# Patient Record
Sex: Male | Born: 1955 | Race: Black or African American | Hispanic: No | Marital: Single | Smoking: Never smoker
Health system: Southern US, Community
[De-identification: ages and names within clinical notes are randomized; demographics above are authoritative.]

## PROBLEM LIST (undated history)

## (undated) DIAGNOSIS — C61 Malignant neoplasm of prostate: Secondary | ICD-10-CM

## (undated) DIAGNOSIS — E7800 Pure hypercholesterolemia, unspecified: Secondary | ICD-10-CM

## (undated) DIAGNOSIS — M1990 Unspecified osteoarthritis, unspecified site: Secondary | ICD-10-CM

## (undated) DIAGNOSIS — I10 Essential (primary) hypertension: Secondary | ICD-10-CM

## (undated) HISTORY — PX: ROTATOR CUFF REPAIR: SHX139

## (undated) HISTORY — PX: ELBOW SURGERY: SHX618

## (undated) HISTORY — PX: KNEE ARTHROSCOPY: SUR90

## (undated) HISTORY — PX: PROSTATE SURGERY: SHX751

---

## 2009-07-12 ENCOUNTER — Emergency Department (HOSPITAL_COMMUNITY): Admission: EM | Admit: 2009-07-12 | Discharge: 2009-07-12 | Payer: Self-pay | Admitting: Emergency Medicine

## 2010-09-03 ENCOUNTER — Emergency Department (HOSPITAL_COMMUNITY)
Admission: EM | Admit: 2010-09-03 | Discharge: 2010-09-03 | Disposition: A | Discharge status: Home / Self Care | Payer: Self-pay | Attending: Emergency Medicine | Admitting: Emergency Medicine

## 2010-09-03 DIAGNOSIS — N509 Disorder of male genital organs, unspecified: Secondary | ICD-10-CM | POA: Insufficient documentation

## 2010-09-03 DIAGNOSIS — E785 Hyperlipidemia, unspecified: Secondary | ICD-10-CM | POA: Insufficient documentation

## 2010-09-03 DIAGNOSIS — M545 Low back pain, unspecified: Secondary | ICD-10-CM | POA: Insufficient documentation

## 2010-09-03 DIAGNOSIS — M129 Arthropathy, unspecified: Secondary | ICD-10-CM | POA: Insufficient documentation

## 2010-09-03 DIAGNOSIS — R300 Dysuria: Secondary | ICD-10-CM | POA: Insufficient documentation

## 2010-09-03 DIAGNOSIS — N419 Inflammatory disease of prostate, unspecified: Secondary | ICD-10-CM | POA: Insufficient documentation

## 2010-09-03 DIAGNOSIS — I10 Essential (primary) hypertension: Secondary | ICD-10-CM | POA: Insufficient documentation

## 2010-09-03 DIAGNOSIS — Z79899 Other long term (current) drug therapy: Secondary | ICD-10-CM | POA: Insufficient documentation

## 2010-09-03 LAB — URINALYSIS, ROUTINE W REFLEX MICROSCOPIC
Hgb urine dipstick: NEGATIVE
Leukocytes, UA: NEGATIVE
Specific Gravity, Urine: 1.017 (ref 1.005–1.030)
Urobilinogen, UA: 0.2 mg/dL (ref 0.0–1.0)
pH: 7.5 (ref 5.0–8.0)

## 2011-01-28 ENCOUNTER — Emergency Department (HOSPITAL_BASED_OUTPATIENT_CLINIC_OR_DEPARTMENT_OTHER)
Admission: EM | Admit: 2011-01-28 | Discharge: 2011-01-28 | Disposition: A | Discharge status: Home / Self Care | Payer: Medicare Other | Attending: Emergency Medicine | Admitting: Emergency Medicine

## 2011-01-28 ENCOUNTER — Encounter (HOSPITAL_BASED_OUTPATIENT_CLINIC_OR_DEPARTMENT_OTHER): Payer: Self-pay | Admitting: Family Medicine

## 2011-01-28 DIAGNOSIS — Z8739 Personal history of other diseases of the musculoskeletal system and connective tissue: Secondary | ICD-10-CM | POA: Insufficient documentation

## 2011-01-28 DIAGNOSIS — R109 Unspecified abdominal pain: Secondary | ICD-10-CM | POA: Insufficient documentation

## 2011-01-28 DIAGNOSIS — N419 Inflammatory disease of prostate, unspecified: Secondary | ICD-10-CM

## 2011-01-28 DIAGNOSIS — E119 Type 2 diabetes mellitus without complications: Secondary | ICD-10-CM | POA: Insufficient documentation

## 2011-01-28 DIAGNOSIS — I10 Essential (primary) hypertension: Secondary | ICD-10-CM | POA: Insufficient documentation

## 2011-01-28 DIAGNOSIS — E7800 Pure hypercholesterolemia, unspecified: Secondary | ICD-10-CM | POA: Insufficient documentation

## 2011-01-28 DIAGNOSIS — Z79899 Other long term (current) drug therapy: Secondary | ICD-10-CM | POA: Insufficient documentation

## 2011-01-28 HISTORY — DX: Unspecified osteoarthritis, unspecified site: M19.90

## 2011-01-28 HISTORY — DX: Essential (primary) hypertension: I10

## 2011-01-28 HISTORY — DX: Pure hypercholesterolemia, unspecified: E78.00

## 2011-01-28 NOTE — ED Provider Notes (Signed)
History     CSN: [id number]  Arrival date & time 01/28/11  0802   First MD Initiated Contact with Patient 01/28/11 [phone]      Chief Complaint  Patient presents with  . Groin Pain    (Consider location/radiation/quality/duration/timing/severity/associated sxs/prior treatment) HPI  Patient states he has had pain in the groin area for a month. The pain occurs when he attempts to urinate. He was seen by a urologist last week and started on Bactrim. He states he is here for second opinion. He has not had fever or chills, nausea or vomiting. He has not had this problem prior to a month ago. He states that he was seen at the community [facility] but has plans to followup at a primary care doctor now. He denies any urethral discharge, swelling, testicular pain, or pain with increased with lifting. Upon further investigation by nursing, it was found that he has only taken 2 of the 40 Bactrim he was prescribed last week. He states that he was going to have it filled today because he couldn't afford it initially.  Past Medical History  Diagnosis Date  . Hypertension   . Arthritis   . Diabetes mellitus   . High cholesterol     Past Surgical History  Procedure Date  . Knee arthroscopy   . Rotator cuff repair     No family history on file.  History  Substance Use Topics  . Smoking status: Never Smoker   . Smokeless tobacco: Not on file  . Alcohol Use: Yes      Review of Systems  All other systems reviewed and are negative.    Allergies  Review of patient's allergies indicates no known allergies.  Home Medications   Current Outpatient Rx  Name Route Sig Dispense Refill  . AMITRIPTYLINE HCL 25 MG PO TABS Oral Take 25 mg by mouth at bedtime.    Marland Kitchen AMLODIPINE BESYLATE 10 MG PO TABS Oral Take 10 mg by mouth daily.    . ATORVASTATIN CALCIUM 40 MG PO TABS Oral Take 40 mg by mouth daily.    . MELOXICAM 7.5 MG PO TABS Oral Take 7.5 mg by mouth daily.    Marland Kitchen METFORMIN HCL 500 MG PO TABS  Oral Take 500 mg by mouth 2 (two) times daily with a meal.    . SIMVASTATIN 40 MG PO TABS Oral Take 40 mg by mouth every evening.    . SULFAMETHOXAZOLE-TMP DS 800-160 MG PO TABS Oral Take 1 tablet by mouth.      BP 135/90  Pulse 71  Temp(Src) 97.9 F (36.6 C) (Oral)  Resp 16  Ht 5\' 9"  (1.753 m)  Wt 190 lb (86.183 kg)  BMI 28.06 kg/m2  SpO2 100%  Physical Exam  Vitals reviewed. Constitutional: He is oriented to person, place, and time. He appears well-developed and well-nourished.  HENT:  Head: Normocephalic and atraumatic.  Eyes: Conjunctivae are normal. Pupils are equal, round, and reactive to light.  Neck: Normal range of motion. Neck supple.  Cardiovascular: Normal rate.   Pulmonary/Chest: Effort normal and breath sounds normal.  Abdominal: Soft. Bowel sounds are normal.  Genitourinary: Rectum normal, testes normal and penis normal. Prostate is enlarged and tender.  Musculoskeletal: Normal range of motion.  Neurological: He is alert and oriented to person, place, and time.  Skin: Skin is warm and dry.    ED Course  Procedures (including critical care time)  Labs Reviewed - No data to display No results found.  No diagnosis found.    MDM  Patient has been seen and diagnosed by urology. I do not think that further urinalysis would be helpful here. He is advised to get his Bactrim filled and to take the entire course. He is to followup with urology.       Hilario Quarry, MD 01/28/11 [phone]

## 2011-01-28 NOTE — ED Notes (Addendum)
Pt c/o groin pain x 2 wks intermittent and worse when having to urinate. Pt denies injury, denies swelling. Pt sts he went to urology on [address] and they dx pt with "prostate infection". Pt sts he wants a second opinion.

## 2012-07-18 ENCOUNTER — Encounter (HOSPITAL_BASED_OUTPATIENT_CLINIC_OR_DEPARTMENT_OTHER): Payer: Self-pay | Admitting: *Deleted

## 2012-07-18 ENCOUNTER — Emergency Department (HOSPITAL_BASED_OUTPATIENT_CLINIC_OR_DEPARTMENT_OTHER)
Admission: EM | Admit: 2012-07-18 | Discharge: 2012-07-18 | Disposition: A | Discharge status: Home / Self Care | Payer: Medicare Other | Attending: Emergency Medicine | Admitting: Emergency Medicine

## 2012-07-18 DIAGNOSIS — H6123 Impacted cerumen, bilateral: Secondary | ICD-10-CM

## 2012-07-18 DIAGNOSIS — Z8739 Personal history of other diseases of the musculoskeletal system and connective tissue: Secondary | ICD-10-CM | POA: Insufficient documentation

## 2012-07-18 DIAGNOSIS — E119 Type 2 diabetes mellitus without complications: Secondary | ICD-10-CM | POA: Insufficient documentation

## 2012-07-18 DIAGNOSIS — I10 Essential (primary) hypertension: Secondary | ICD-10-CM | POA: Insufficient documentation

## 2012-07-18 DIAGNOSIS — E7800 Pure hypercholesterolemia, unspecified: Secondary | ICD-10-CM | POA: Insufficient documentation

## 2012-07-18 DIAGNOSIS — Z79899 Other long term (current) drug therapy: Secondary | ICD-10-CM | POA: Insufficient documentation

## 2012-07-18 DIAGNOSIS — H6120 Impacted cerumen, unspecified ear: Secondary | ICD-10-CM | POA: Insufficient documentation

## 2012-07-18 MED ORDER — DOCUSATE SODIUM 50 MG/5ML PO LIQD
ORAL | Status: AC
  Administered 2012-07-18: 50 mg via OTIC
  Filled 2012-07-18: qty 10

## 2012-07-18 MED ORDER — DOCUSATE SODIUM 50 MG/5ML PO LIQD
50.0000 mg | Freq: Once | ORAL | Status: AC
  Administered 2012-07-18: 50 mg via OTIC

## 2012-07-18 NOTE — ED Notes (Signed)
Patient c/o L ear pain for past two weeks, no meds taken

## 2012-07-18 NOTE — ED Provider Notes (Signed)
History    CSN: [id number] Arrival date & time 07/18/12  0911  First MD Initiated Contact with Patient 07/18/12 [phone]     Chief Complaint  Patient presents with  . Otalgia   (Consider location/radiation/quality/duration/timing/severity/associated sxs/prior Treatment) Patient is a 57 y.o. male presenting with ear pain. The history is provided by the patient.  Otalgia Location:  Left Behind ear:  No abnormality Quality:  Aching, pressure and sore Severity:  Moderate Onset quality:  Gradual Duration:  2 weeks Timing:  Intermittent (started intermittent and now constant) Progression:  Worsening Chronicity:  New Context: not direct blow, not elevation change, not foreign body in ear and not loud noise   Relieved by:  Nothing Ineffective treatments:  None tried Associated symptoms: no abdominal pain, no ear discharge, no fever, no hearing loss, no neck pain and no vomiting   Risk factors: no chronic ear infection    Past Medical History  Diagnosis Date  . Hypertension   . Arthritis   . Diabetes mellitus   . High cholesterol    Past Surgical History  Procedure Laterality Date  . Knee arthroscopy    . Rotator cuff repair     No family history on file. History  Substance Use Topics  . Smoking status: Never Smoker   . Smokeless tobacco: Not on file  . Alcohol Use: Yes    Review of Systems  Constitutional: Negative for fever.  HENT: Positive for ear pain. Negative for hearing loss, neck pain and ear discharge.   Gastrointestinal: Negative for vomiting and abdominal pain.  All other systems reviewed and are negative.    Allergies  Review of patient's allergies indicates no known allergies.  Home Medications   Current Outpatient Rx  Name  Route  Sig  Dispense  Refill  . amitriptyline (ELAVIL) 25 MG tablet   Oral   Take 25 mg by mouth at bedtime.         Marland Kitchen amLODipine (NORVASC) 10 MG tablet   Oral   Take 10 mg by mouth daily.         Marland Kitchen atorvastatin  (LIPITOR) 40 MG tablet   Oral   Take 40 mg by mouth daily.         . meloxicam (MOBIC) 7.5 MG tablet   Oral   Take 7.5 mg by mouth daily.         . metFORMIN (GLUCOPHAGE) 500 MG tablet   Oral   Take 500 mg by mouth 2 (two) times daily with a meal.         . simvastatin (ZOCOR) 40 MG tablet   Oral   Take 40 mg by mouth every evening.         . sulfamethoxazole-trimethoprim (BACTRIM DS) 800-160 MG per tablet   Oral   Take 1 tablet by mouth.          BP 135/92  Pulse 95  Temp(Src) 98.1 F (36.7 C) (Oral)  Resp 16  Ht 5\' 9"  (1.753 m)  Wt 200 lb (90.719 kg)  BMI 29.52 kg/m2  SpO2 95% Physical Exam  Nursing note and vitals reviewed. Constitutional: He is oriented to person, place, and time. He appears well-developed and well-nourished. No distress.  HENT:  Head: Normocephalic and atraumatic.  Right Ear: External ear normal. No drainage or swelling. No mastoid tenderness.  Left Ear: External ear normal. No drainage or swelling. No mastoid tenderness.  Pain with movement of the left ear with cerumen impaction.  Cerumen impaction  on the right as well  Eyes: EOM are normal. Pupils are equal, round, and reactive to light.  Cardiovascular: Normal rate.   Pulmonary/Chest: Effort normal.  Neurological: He is alert and oriented to person, place, and time.  Skin: Skin is warm and dry. No rash noted. No erythema.    ED Course  Procedures (including critical care time) Labs Reviewed - No data to display No results found. 1. Cerumen impaction, bilateral     MDM   Patient here complaining of left ear pain without infectious symptoms. No mastoid tenderness concerning for mastoiditis. Pain with movement of the external ear cerumen impaction upon exam. Unable to renew with curettes so Colace applied to the ear and will attempt cerumen removal a second time.  12:43 PM After multiple flushes and colace large ball of cerumen removed from both ears.  Gwyneth Sprout,  MD 07/18/12 1244

## 2012-07-18 NOTE — ED Notes (Signed)
Flushed patient's ear with colace and warm water to dissolve wax buildup in L ear

## 2012-11-19 ENCOUNTER — Emergency Department (HOSPITAL_BASED_OUTPATIENT_CLINIC_OR_DEPARTMENT_OTHER)
Admission: EM | Admit: 2012-11-19 | Discharge: 2012-11-19 | Disposition: A | Discharge status: Home / Self Care | Payer: Medicare Other | Attending: Emergency Medicine | Admitting: Emergency Medicine

## 2012-11-19 ENCOUNTER — Encounter (HOSPITAL_BASED_OUTPATIENT_CLINIC_OR_DEPARTMENT_OTHER): Payer: Self-pay | Admitting: Emergency Medicine

## 2012-11-19 DIAGNOSIS — Z79899 Other long term (current) drug therapy: Secondary | ICD-10-CM | POA: Insufficient documentation

## 2012-11-19 DIAGNOSIS — M129 Arthropathy, unspecified: Secondary | ICD-10-CM | POA: Insufficient documentation

## 2012-11-19 DIAGNOSIS — B86 Scabies: Secondary | ICD-10-CM

## 2012-11-19 DIAGNOSIS — I10 Essential (primary) hypertension: Secondary | ICD-10-CM | POA: Insufficient documentation

## 2012-11-19 DIAGNOSIS — E7800 Pure hypercholesterolemia, unspecified: Secondary | ICD-10-CM | POA: Insufficient documentation

## 2012-11-19 DIAGNOSIS — E119 Type 2 diabetes mellitus without complications: Secondary | ICD-10-CM | POA: Insufficient documentation

## 2012-11-19 DIAGNOSIS — Z791 Long term (current) use of non-steroidal anti-inflammatories (NSAID): Secondary | ICD-10-CM | POA: Insufficient documentation

## 2012-11-19 MED ORDER — PERMETHRIN 5 % EX CREA: TOPICAL_CREAM | CUTANEOUS | Status: DC

## 2012-11-19 NOTE — ED Provider Notes (Signed)
CSN: [id number]     Arrival date & time 11/19/12  1157 History   First MD Initiated Contact with Patient 11/19/12 1217     Chief Complaint  Patient presents with  . Rash   (Consider location/radiation/quality/duration/timing/severity/associated sxs/prior Treatment) Patient is a 57 y.o. male presenting with rash. The history is provided by the patient. No language interpreter was used.  Rash Location:  Full body Quality: itchiness   Severity:  Moderate Onset quality:  Gradual Duration:  3 days Timing:  Constant Progression:  Worsening Chronicity:  New Context: insect bite/sting   Relieved by:  Nothing Worsened by:  Nothing tried Ineffective treatments:  None tried   Past Medical History  Diagnosis Date  . Hypertension   . Arthritis   . Diabetes mellitus   . High cholesterol    Past Surgical History  Procedure Laterality Date  . Knee arthroscopy    . Rotator cuff repair     No family history on file. History  Substance Use Topics  . Smoking status: Never Smoker   . Smokeless tobacco: Not on file  . Alcohol Use: Yes     Comment: occasional    Review of Systems  Skin: Positive for rash.  All other systems reviewed and are negative.    Allergies  Review of patient's allergies indicates no known allergies.  Home Medications   Current Outpatient Rx  Name  Route  Sig  Dispense  Refill  . amitriptyline (ELAVIL) 25 MG tablet   Oral   Take 25 mg by mouth at bedtime.         Marland Kitchen amLODipine (NORVASC) 10 MG tablet   Oral   Take 10 mg by mouth daily.         Marland Kitchen atorvastatin (LIPITOR) 40 MG tablet   Oral   Take 40 mg by mouth daily.         . meloxicam (MOBIC) 7.5 MG tablet   Oral   Take 7.5 mg by mouth daily.         . metFORMIN (GLUCOPHAGE) 500 MG tablet   Oral   Take 500 mg by mouth 2 (two) times daily with a meal.         . permethrin (ELIMITE) 5 % cream      Apply to affected area once   60 g   1    BP 154/97  Pulse 94  Temp(Src) 98.4  F (36.9 C) (Oral)  Resp 18  Ht 5\' 9"  (1.753 m)  Wt 193 lb (87.544 kg)  BMI 28.49 kg/m2  SpO2 100% Physical Exam  Nursing note and vitals reviewed. Constitutional: He is oriented to person, place, and time. He appears well-developed and well-nourished.  HENT:  Head: Normocephalic.  Eyes: EOM are normal.  Neck: Normal range of motion.  Pulmonary/Chest: Effort normal.  Musculoskeletal: Normal range of motion.  Neurological: He is alert and oriented to person, place, and time.  Skin: Rash noted.  Multiple burrows   Psychiatric: He has a normal mood and affect.    ED Course  Procedures (including critical care time) Labs Review Labs Reviewed - No data to display Imaging Review No results found.  EKG Interpretation   None       MDM   1. Scabies    elemite    Elson Areas, PA-C 11/19/12 1230

## 2012-11-19 NOTE — ED Notes (Signed)
Rash on upper extremities since Friday.

## 2012-11-20 NOTE — ED Provider Notes (Signed)
Medical screening examination/treatment/procedure(s) were performed by non-physician practitioner and as supervising physician I was immediately available for consultation/collaboration.  EKG Interpretation   None         Candyce Churn, MD 11/20/12 [phone]

## 2014-03-12 ENCOUNTER — Emergency Department (HOSPITAL_BASED_OUTPATIENT_CLINIC_OR_DEPARTMENT_OTHER): Payer: Medicare Other

## 2014-03-12 ENCOUNTER — Encounter (HOSPITAL_BASED_OUTPATIENT_CLINIC_OR_DEPARTMENT_OTHER): Payer: Self-pay

## 2014-03-12 ENCOUNTER — Emergency Department (HOSPITAL_BASED_OUTPATIENT_CLINIC_OR_DEPARTMENT_OTHER)
Admission: EM | Admit: 2014-03-12 | Discharge: 2014-03-12 | Disposition: A | Discharge status: Home / Self Care | Payer: Medicare Other | Attending: Emergency Medicine | Admitting: Emergency Medicine

## 2014-03-12 DIAGNOSIS — I10 Essential (primary) hypertension: Secondary | ICD-10-CM | POA: Insufficient documentation

## 2014-03-12 DIAGNOSIS — E119 Type 2 diabetes mellitus without complications: Secondary | ICD-10-CM | POA: Diagnosis not present

## 2014-03-12 DIAGNOSIS — Z8739 Personal history of other diseases of the musculoskeletal system and connective tissue: Secondary | ICD-10-CM | POA: Diagnosis not present

## 2014-03-12 DIAGNOSIS — K2970 Gastritis, unspecified, without bleeding: Secondary | ICD-10-CM

## 2014-03-12 DIAGNOSIS — R079 Chest pain, unspecified: Secondary | ICD-10-CM | POA: Diagnosis present

## 2014-03-12 DIAGNOSIS — Z79899 Other long term (current) drug therapy: Secondary | ICD-10-CM | POA: Insufficient documentation

## 2014-03-12 LAB — BASIC METABOLIC PANEL
ANION GAP: 12 (ref 5–15)
BUN: 9 mg/dL (ref 6–23)
CALCIUM: 8.9 mg/dL (ref 8.4–10.5)
CHLORIDE: 95 mmol/L — AB (ref 96–112)
CO2: 26 mmol/L (ref 19–32)
Creatinine, Ser: 1.33 mg/dL (ref 0.50–1.35)
GFR calc Af Amer: 66 mL/min — ABNORMAL LOW (ref >90)
GFR, EST NON AFRICAN AMERICAN: 57 mL/min — AB (ref >90)
Glucose, Bld: 107 mg/dL — ABNORMAL HIGH (ref 70–99)
POTASSIUM: 4 mmol/L (ref 3.5–5.1)
SODIUM: 133 mmol/L — AB (ref 135–145)

## 2014-03-12 LAB — CBG MONITORING, ED: GLUCOSE-CAPILLARY: 147 mg/dL — AB (ref 70–99)

## 2014-03-12 LAB — CBC
HEMATOCRIT: 41.3 % (ref 39.0–52.0)
HEMOGLOBIN: 13.9 g/dL (ref 13.0–17.0)
MCH: 27.4 pg (ref 26.0–34.0)
MCHC: 33.7 g/dL (ref 30.0–36.0)
MCV: 81.3 fL (ref 78.0–100.0)
Platelets: 214 10*3/uL (ref 150–400)
RBC: 5.08 MIL/uL (ref 4.22–5.81)
RDW: 13.9 % (ref 11.5–15.5)
WBC: 11.3 10*3/uL — ABNORMAL HIGH (ref 4.0–10.5)

## 2014-03-12 LAB — TROPONIN I: Troponin I: <0.03 ng/mL (ref <0.031)

## 2014-03-12 LAB — LIPASE, BLOOD: LIPASE: 31 U/L (ref 11–59)

## 2014-03-12 MED ORDER — PANTOPRAZOLE SODIUM 40 MG IV SOLR
40.0000 mg | Freq: Once | INTRAVENOUS | Status: AC
  Administered 2014-03-12: 40 mg via INTRAVENOUS
  Filled 2014-03-12: qty 40

## 2014-03-12 MED ORDER — SODIUM CHLORIDE 0.9 % IV BOLUS (SEPSIS)
1000.0000 mL | Freq: Once | INTRAVENOUS | Status: AC
  Administered 2014-03-12: 1000 mL via INTRAVENOUS

## 2014-03-12 MED ORDER — OMEPRAZOLE 40 MG PO CPDR: 40.0000 mg | DELAYED_RELEASE_CAPSULE | Freq: Every day | ORAL | Status: DC

## 2014-03-12 MED ORDER — SUCRALFATE 1 G PO TABS: 1.0000 g | ORAL_TABLET | Freq: Three times a day (TID) | ORAL | Status: DC

## 2014-03-12 MED ORDER — GI COCKTAIL ~~LOC~~
30.0000 mL | Freq: Once | ORAL | Status: AC
  Administered 2014-03-12: 30 mL via ORAL
  Filled 2014-03-12: qty 30

## 2014-03-12 NOTE — Discharge Instructions (Signed)

## 2014-03-12 NOTE — ED Notes (Signed)
Patient c/o heartburn since yesterday, took tums no relief, took pepcid this morning and felt a little relief

## 2014-03-12 NOTE — ED Provider Notes (Signed)
CSN: [id number]     Arrival date & time 03/12/14  1013 History   First MD Initiated Contact with Patient 03/12/14 1028     Chief Complaint  Patient presents with  . Chest Pain     (Consider location/radiation/quality/duration/timing/severity/associated sxs/prior Treatment) HPI Comments: Patient presents with heartburn. He states that he's had a burning in his mouth and down his throat and into his upper abdomen that started yesterday and it's been constant since that time. It's gotten worse this morning. He feels a burning taste in his mouth and his throat. He states he drank about a quart of beer yesterday. He has a history of reflux. He denies any known history of heart disease. He denies any shortness of breath. He denies any diaphoresis. The pain is not related to exertion. He denies any leg pain or swelling. He denies any nausea or vomiting.   Past Medical History  Diagnosis Date  . Hypertension   . Arthritis   . Diabetes mellitus   . High cholesterol    Past Surgical History  Procedure Laterality Date  . Knee arthroscopy    . Rotator cuff repair     No family history on file. History  Substance Use Topics  . Smoking status: Never Smoker   . Smokeless tobacco: Not on file  . Alcohol Use: Yes     Comment: occasional    Review of Systems  Constitutional: Negative for fever, chills, diaphoresis and fatigue.  HENT: Negative for congestion, rhinorrhea and sneezing.   Eyes: Negative.   Respiratory: Negative for cough, chest tightness and shortness of breath.   Cardiovascular: Negative for chest pain and leg swelling.  Gastrointestinal: Positive for abdominal pain. Negative for nausea, vomiting, diarrhea and blood in stool.  Genitourinary: Negative for frequency, hematuria, flank pain and difficulty urinating.  Musculoskeletal: Negative for back pain and arthralgias.  Skin: Negative for rash.  Neurological: Negative for dizziness, speech difficulty, weakness, numbness and  headaches.      Allergies  Review of patient's allergies indicates no known allergies.  Home Medications   Prior to Admission medications   Medication Sig Start Date End Date Taking? Authorizing Provider  metFORMIN (GLUCOPHAGE) 500 MG tablet Take 500 mg by mouth 2 (two) times daily with a meal.    Historical Provider, MD  omeprazole (PRILOSEC) 40 MG capsule Take 1 capsule (40 mg total) by mouth daily. 03/12/14   Malvin Johns, MD  permethrin (ELIMITE) 5 % cream Apply to affected area once 11/19/12   Fransico Meadow, PA-C  sucralfate (CARAFATE) 1 G tablet Take 1 tablet (1 g total) by mouth 4 (four) times daily -  with meals and at bedtime. 03/12/14   Malvin Johns, MD   BP 144/91 mmHg  Pulse 104  Temp(Src) 98.5 F (36.9 C)  Resp 18  Ht 5\' 9"  (1.753 m)  Wt 180 lb (81.647 kg)  BMI 26.57 kg/m2  SpO2 98% Physical Exam  Constitutional: He is oriented to person, place, and time. He appears well-developed and well-nourished.  HENT:  Head: Normocephalic and atraumatic.  Eyes: Pupils are equal, round, and reactive to light.  Neck: Normal range of motion. Neck supple.  Cardiovascular: Normal rate, regular rhythm and normal heart sounds.   Pulmonary/Chest: Effort normal and breath sounds normal. No respiratory distress. He has no wheezes. He has no rales. He exhibits no tenderness.  Abdominal: Soft. Bowel sounds are normal. There is tenderness (Positive mild tenderness to the epigastrium). There is no rebound and no guarding.  Musculoskeletal: Normal range of motion. He exhibits no edema.  Lymphadenopathy:    He has no cervical adenopathy.  Neurological: He is alert and oriented to person, place, and time.  Skin: Skin is warm and dry. No rash noted.  Psychiatric: He has a normal mood and affect.    ED Course  Procedures (including critical care time) Labs Review Labs Reviewed  BASIC METABOLIC PANEL - Abnormal; Notable for the following:    Sodium 133 (*)    Chloride 95 (*)     Glucose, Bld 107 (*)    GFR calc non Af Amer 57 (*)    GFR calc Af Amer 66 (*)    All other components within normal limits  CBC - Abnormal; Notable for the following:    WBC 11.3 (*)    All other components within normal limits  CBG MONITORING, ED - Abnormal; Notable for the following:    Glucose-Capillary 147 (*)    All other components within normal limits  TROPONIN I  LIPASE, BLOOD    Imaging Review Dg Chest 2 View  03/12/2014   CLINICAL DATA:  Heartburn.  Short of breath for 1 day.  EXAM: CHEST  2 VIEW  COMPARISON:  None.  FINDINGS: Mild bibasilar atelectasis. Normal heart size. 7 mm left midlung nodule. No pneumothorax. No pleural effusion.  IMPRESSION: 7 mm left midlung nodule. Comparison with prior studies would be helpful. If none are available, CT can be performed.   Electronically Signed   By: Marybelle Killings M.D.   On: 03/12/2014 11:30     EKG Interpretation   Date/Time:  Saturday March 12 2014 10:21:15 EST Ventricular Rate:  104 PR Interval:  178 QRS Duration: 84 QT Interval:  330 QTC Calculation: 433 R Axis:   20 Text Interpretation:  Sinus tachycardia Moderate voltage criteria for LVH,  may be normal variant Borderline ECG No old tracing to compare Confirmed  by Criss Bartles  MD, Prabhjot Maddux ([id number]) on 03/12/2014 10:34:19 AM      MDM   Final diagnoses:  Gastritis    Patient got almost complete relief with a GI cocktail. He was also given Protonix. His pain came back a little bit during the ED stay but was improved. His symptoms sound consistent with gastritis. There is no evidence of pancreatitis. He his pain was almost completely relieved with a GI cocktail and I have low suspicion for other etiologies such as perforated bowel. He's had no vomiting. His symptoms do not sound consistent with acute coronary syndrome. His troponin is negative. He was discharged home in good condition with a prescription for Carafate and Prilosec. He was encouraged to stop drinking. He was  given a referral to follow-up with GI. Return precautions were given.   Malvin Johns, MD 03/12/14 1328

## 2014-07-23 ENCOUNTER — Emergency Department (HOSPITAL_BASED_OUTPATIENT_CLINIC_OR_DEPARTMENT_OTHER)
Admission: EM | Admit: 2014-07-23 | Discharge: 2014-07-23 | Disposition: A | Discharge status: Home / Self Care | Payer: Medicare Other | Attending: Emergency Medicine | Admitting: Emergency Medicine

## 2014-07-23 ENCOUNTER — Emergency Department (HOSPITAL_BASED_OUTPATIENT_CLINIC_OR_DEPARTMENT_OTHER): Payer: Medicare Other

## 2014-07-23 ENCOUNTER — Encounter (HOSPITAL_BASED_OUTPATIENT_CLINIC_OR_DEPARTMENT_OTHER): Payer: Self-pay

## 2014-07-23 DIAGNOSIS — M1990 Unspecified osteoarthritis, unspecified site: Secondary | ICD-10-CM | POA: Diagnosis not present

## 2014-07-23 DIAGNOSIS — J029 Acute pharyngitis, unspecified: Secondary | ICD-10-CM | POA: Diagnosis present

## 2014-07-23 DIAGNOSIS — R05 Cough: Secondary | ICD-10-CM

## 2014-07-23 DIAGNOSIS — R059 Cough, unspecified: Secondary | ICD-10-CM

## 2014-07-23 DIAGNOSIS — Z79899 Other long term (current) drug therapy: Secondary | ICD-10-CM | POA: Diagnosis not present

## 2014-07-23 DIAGNOSIS — I10 Essential (primary) hypertension: Secondary | ICD-10-CM | POA: Diagnosis not present

## 2014-07-23 DIAGNOSIS — E119 Type 2 diabetes mellitus without complications: Secondary | ICD-10-CM | POA: Insufficient documentation

## 2014-07-23 DIAGNOSIS — J069 Acute upper respiratory infection, unspecified: Secondary | ICD-10-CM | POA: Diagnosis not present

## 2014-07-23 DIAGNOSIS — R21 Rash and other nonspecific skin eruption: Secondary | ICD-10-CM | POA: Insufficient documentation

## 2014-07-23 LAB — RAPID STREP SCREEN (MED CTR MEBANE ONLY): Streptococcus, Group A Screen (Direct): NEGATIVE

## 2014-07-23 MED ORDER — HYDROCODONE-ACETAMINOPHEN 5-325 MG PO TABS: 1.0000 | ORAL_TABLET | Freq: Four times a day (QID) | ORAL | Status: DC | PRN

## 2014-07-23 MED ORDER — DM-GUAIFENESIN ER 30-600 MG PO TB12: 1.0000 | ORAL_TABLET | Freq: Two times a day (BID) | ORAL | Status: DC

## 2014-07-23 NOTE — ED Provider Notes (Signed)
CSN: [id number]     Arrival date & time 07/23/14  0857 History   First MD Initiated Contact with Patient 07/23/14 [phone]     Chief Complaint  Patient presents with  . Sore Throat     (Consider location/radiation/quality/duration/timing/severity/associated sxs/prior Treatment) Patient is a 59 y.o. male presenting with pharyngitis. The history is provided by the patient.  Sore Throat Pertinent negatives include no chest pain, no abdominal pain, no headaches and no shortness of breath.   patient with 3 day history of sore throat cough occasionally productive one episode of vomiting. No diarrhea. No fevers. Patient noted white coating on tongue today. No sick exposures.  Past Medical History  Diagnosis Date  . Hypertension   . Arthritis   . Diabetes mellitus   . High cholesterol    Past Surgical History  Procedure Laterality Date  . Knee arthroscopy    . Rotator cuff repair    . Elbow surgery     No family history on file. History  Substance Use Topics  . Smoking status: Never Smoker   . Smokeless tobacco: Not on file  . Alcohol Use: Yes     Comment: occasional    Review of Systems  Constitutional: Negative for fever.  HENT: Positive for congestion and sore throat.   Respiratory: Positive for cough. Negative for shortness of breath.   Cardiovascular: Negative for chest pain.  Gastrointestinal: Negative for abdominal pain.  Genitourinary: Negative for dysuria.  Musculoskeletal: Negative for myalgias and neck pain.  Skin: Positive for rash.  Neurological: Negative for headaches.  Hematological: Does not bruise/bleed easily.  Psychiatric/Behavioral: Negative for confusion.      Allergies  Review of patient's allergies indicates no known allergies.  Home Medications   Prior to Admission medications   Medication Sig Start Date End Date Taking? Authorizing Provider  dextromethorphan-guaiFENesin (MUCINEX DM) 30-600 MG per 12 hr tablet Take 1 tablet by mouth 2 (two) times  daily. 07/23/14   Fredia Sorrow, MD  HYDROcodone-acetaminophen (NORCO/VICODIN) 5-325 MG per tablet Take 1-2 tablets by mouth every 6 (six) hours as needed for moderate pain. 07/23/14   Fredia Sorrow, MD  metFORMIN (GLUCOPHAGE) 500 MG tablet Take 500 mg by mouth 2 (two) times daily with a meal.    Historical Provider, MD  omeprazole (PRILOSEC) 40 MG capsule Take 1 capsule (40 mg total) by mouth daily. 03/12/14   Malvin Johns, MD  permethrin (ELIMITE) 5 % cream Apply to affected area once 11/19/12   Fransico Meadow, PA-C  sucralfate (CARAFATE) 1 G tablet Take 1 tablet (1 g total) by mouth 4 (four) times daily -  with meals and at bedtime. 03/12/14   Malvin Johns, MD   BP 138/89 mmHg  Pulse 86  Temp(Src) 98.2 F (36.8 C) (Oral)  Resp 18  Ht 5\' 9"  (1.753 m)  Wt 199 lb (90.266 kg)  BMI 29.37 kg/m2  SpO2 96% Physical Exam  Constitutional: He is oriented to person, place, and time. He appears well-developed and well-nourished. No distress.  HENT:  Head: Normocephalic and atraumatic.  Mouth/Throat: Oropharynx is clear and moist. No oropharyngeal exudate.  White coating on tongue.  Eyes: Conjunctivae and EOM are normal. Pupils are equal, round, and reactive to light.  Neck: Normal range of motion. Neck supple.  Cardiovascular: Normal rate, regular rhythm and normal heart sounds.   No murmur heard. Pulmonary/Chest: Effort normal and breath sounds normal. No respiratory distress.  Abdominal: Soft. Bowel sounds are normal. There is no tenderness.  Musculoskeletal:  Normal range of motion.  Neurological: He is alert and oriented to person, place, and time. No cranial nerve deficit. He exhibits normal muscle tone. Coordination normal.  Skin: Skin is warm. No rash noted.  Nursing note and vitals reviewed.   ED Course  Procedures (including critical care time) Labs Review Labs Reviewed  RAPID STREP SCREEN (NOT AT [facility])  CULTURE, GROUP A STREP    Imaging Review Dg Chest 2 View  07/23/2014    CLINICAL DATA:  Sore throat for 3 days and cough  EXAM: CHEST  2 VIEW  COMPARISON:  03/12/2014  FINDINGS: Normal heart size. Clear lungs. No pleural effusion or pneumothorax.  IMPRESSION: No active cardiopulmonary disease.   Electronically Signed   By: Marybelle Killings M.D.   On: 07/23/2014 09:42     EKG Interpretation None      MDM   Final diagnoses:  Cough  Pharyngitis  URI (upper respiratory infection)    Patient with 3 day history of sore throat and mild cough occasionally productive. White coating on tongue. No fevers. One episode of vomiting no diarrhea no abdominal pain.  Rapid strep was negative and chest x-ray was negative for pneumonia. Will treat as an upper respiratory infection with a pharyngitis, most likely related to a virus. Patient nontoxic no acute distress.      Fredia Sorrow, MD 07/23/14 [phone]

## 2014-07-23 NOTE — ED Notes (Signed)
Patient transported to X-ray ambulatory with tech. 

## 2014-07-23 NOTE — ED Notes (Signed)
Pt reports 2-3 days of sore throat, white coated tongue and cough.  Denies sinus pressure.

## 2014-07-23 NOTE — ED Notes (Signed)
MD at bedside. 

## 2014-07-23 NOTE — Discharge Instructions (Signed)
Rapid strep test was negative. Chest x-ray was negative for any signs of pneumonia. This is most likely a viral upper respiratory infection. Take the hydrocodone as needed for the throat pain. Take the Mucinex DM as needed for congestion and cough. Return for any new or worse symptoms.

## 2014-07-25 LAB — CULTURE, GROUP A STREP: Strep A Culture: NEGATIVE

## 2014-08-08 ENCOUNTER — Encounter (HOSPITAL_BASED_OUTPATIENT_CLINIC_OR_DEPARTMENT_OTHER): Payer: Self-pay | Admitting: *Deleted

## 2014-08-08 ENCOUNTER — Emergency Department (HOSPITAL_BASED_OUTPATIENT_CLINIC_OR_DEPARTMENT_OTHER)
Admission: EM | Admit: 2014-08-08 | Discharge: 2014-08-08 | Disposition: A | Discharge status: Home / Self Care | Payer: Medicare Other | Attending: Emergency Medicine | Admitting: Emergency Medicine

## 2014-08-08 DIAGNOSIS — I10 Essential (primary) hypertension: Secondary | ICD-10-CM | POA: Diagnosis not present

## 2014-08-08 DIAGNOSIS — Z8739 Personal history of other diseases of the musculoskeletal system and connective tissue: Secondary | ICD-10-CM | POA: Insufficient documentation

## 2014-08-08 DIAGNOSIS — R21 Rash and other nonspecific skin eruption: Secondary | ICD-10-CM | POA: Diagnosis present

## 2014-08-08 DIAGNOSIS — L259 Unspecified contact dermatitis, unspecified cause: Secondary | ICD-10-CM | POA: Insufficient documentation

## 2014-08-08 DIAGNOSIS — Z79899 Other long term (current) drug therapy: Secondary | ICD-10-CM | POA: Insufficient documentation

## 2014-08-08 DIAGNOSIS — E119 Type 2 diabetes mellitus without complications: Secondary | ICD-10-CM | POA: Insufficient documentation

## 2014-08-08 MED ORDER — TRIAMCINOLONE ACETONIDE 0.1 % EX CREA: 1.0000 "application " | TOPICAL_CREAM | Freq: Two times a day (BID) | CUTANEOUS | Status: DC

## 2014-08-08 NOTE — ED Notes (Signed)
Directed to pharmacy to pick up Rx

## 2014-08-08 NOTE — ED Notes (Signed)
PA at bedside.

## 2014-08-08 NOTE — Discharge Instructions (Signed)
Please read and follow all provided instructions.  Your diagnoses today include:  1. Contact dermatitis    Tests performed today include:  Vital signs. See below for your results today.   Medications prescribed:   Triamcinolone - steroid medication for rash  Take any prescribed medications only as directed.  Home care instructions:  Follow any educational materials contained in this packet.  BE VERY CAREFUL not to take multiple medicines containing Tylenol (also called acetaminophen). Doing so can lead to an overdose which can damage your liver and cause liver failure and possibly death.   Follow-up instructions: Please follow-up with your primary care provider in the next 3 days for further evaluation of your symptoms.   Return instructions:   Please return to the Emergency Department if you experience worsening symptoms.    Please return if you have any other emergent concerns.  Additional Information:  Your vital signs today were: BP 135/89 mmHg   Pulse 99   Temp(Src) 98.6 F (37 C) (Oral)   Resp 16   Ht 5\' 9"  (1.753 m)   Wt 190 lb (86.183 kg)   BMI 28.05 kg/m2   SpO2 94% If your blood pressure (BP) was elevated above 135/85 this visit, please have this repeated by your doctor within one month. --------------

## 2014-08-08 NOTE — ED Notes (Signed)
Outside Saturday, noticed rash to right arm Sunday morning- denies pain c/o itching

## 2014-08-08 NOTE — ED Provider Notes (Signed)
CSN: [id number]     Arrival date & time 08/08/14  0914 History   First MD Initiated Contact with Patient 08/08/14 [phone]     Chief Complaint  Patient presents with  . Rash     (Consider location/radiation/quality/duration/timing/severity/associated sxs/prior Treatment) HPI Comments: Patient with history of diabetes, presents with itchy rash on his right forearm starting yesterday. Patient states that he was outside the night before. He is uncertain if he could have been exposed to a plant such as poison ivy. No face or lip swelling, no trouble breathing. No new medications (other than BP med 2 months ago) or foods. No tick bites. No fever, N/V. No contacts with similar symptoms. Rash is isolated to R arm. The onset of this condition was acute. The course is constant. Aggravating factors: none. Alleviating factors: none.    Patient is a 59 y.o. male presenting with rash. The history is provided by the patient.  Rash Associated symptoms: no fever, no myalgias, no nausea, no shortness of breath, not vomiting and not wheezing     Past Medical History  Diagnosis Date  . Hypertension   . Arthritis   . Diabetes mellitus   . High cholesterol    Past Surgical History  Procedure Laterality Date  . Knee arthroscopy    . Rotator cuff repair    . Elbow surgery     No family history on file. History  Substance Use Topics  . Smoking status: Never Smoker   . Smokeless tobacco: Never Used  . Alcohol Use: Yes     Comment: occasional    Review of Systems  Constitutional: Negative for fever.  HENT: Negative for facial swelling and trouble swallowing.   Eyes: Negative for redness.  Respiratory: Negative for shortness of breath, wheezing and stridor.   Cardiovascular: Negative for chest pain.  Gastrointestinal: Negative for nausea and vomiting.  Musculoskeletal: Negative for myalgias.  Skin: Positive for rash.  Neurological: Negative for light-headedness.  Psychiatric/Behavioral: Negative  for confusion.      Allergies  Review of patient's allergies indicates no known allergies.  Home Medications   Prior to Admission medications   Medication Sig Start Date End Date Taking? Authorizing Provider  metFORMIN (GLUCOPHAGE) 500 MG tablet Take 500 mg by mouth 2 (two) times daily with a meal.   Yes Historical Provider, MD  triamcinolone cream (KENALOG) 0.1 % Apply 1 application topically 2 (two) times daily. 08/08/14   Carlisle Cater, PA-C   BP 135/89 mmHg  Pulse 99  Temp(Src) 98.6 F (37 C) (Oral)  Resp 16  Ht 5\' 9"  (1.753 m)  Wt 190 lb (86.183 kg)  BMI 28.05 kg/m2  SpO2 94% Physical Exam  Constitutional: He appears well-developed and well-nourished.  HENT:  Head: Normocephalic and atraumatic.  Eyes: Conjunctivae are normal.  Neck: Normal range of motion. Neck supple.  Pulmonary/Chest: No respiratory distress.  Neurological: He is alert.  Skin: Skin is warm and dry. Rash noted. No erythema.  Patient with multiple small papules over the right forearm and distal upper arm. Some are in a linear distribution. No abscess or cellulitis. Presentation is suggestive of a contact dermatitis.  Psychiatric: He has a normal mood and affect.  Nursing note and vitals reviewed.   ED Course  Procedures (including critical care time) Labs Review Labs Reviewed - No data to display  Imaging Review No results found.   EKG Interpretation None       9:49 AM Patient seen and examined. D/c to home with  triamcinolone cream.   Vital signs reviewed and are as follows: BP 135/89 mmHg  Pulse 99  Temp(Src) 98.6 F (37 C) (Oral)  Resp 16  Ht 5\' 9"  (1.753 m)  Wt 190 lb (86.183 kg)  BMI 28.05 kg/m2  SpO2 94%  Patient urged to return with worsening symptoms or other concerns, especially if it spreads or involves face or lips, causes SOB. Patient verbalized understanding and agrees with plan.     MDM   Final diagnoses:  Contact dermatitis   Patient with itchy rash consistent  with contact dermatitis. No signs of anaphylaxis. No recent tick bites. No systemic symptoms of illness including fever. Patient appears well, nontoxic. Will avoid systemic steroid-induced patient is a diabetic. Feel that given limited area of reaction, feel well suited for topical steroids.    Carlisle Cater, PA-C 08/08/14 [place], MD 08/08/14 [phone]

## 2015-02-11 ENCOUNTER — Encounter (HOSPITAL_BASED_OUTPATIENT_CLINIC_OR_DEPARTMENT_OTHER): Payer: Self-pay | Admitting: *Deleted

## 2015-02-11 ENCOUNTER — Emergency Department (HOSPITAL_BASED_OUTPATIENT_CLINIC_OR_DEPARTMENT_OTHER)
Admission: EM | Admit: 2015-02-11 | Discharge: 2015-02-11 | Disposition: A | Discharge status: Home / Self Care | Payer: Medicare Other | Attending: Emergency Medicine | Admitting: Emergency Medicine

## 2015-02-11 DIAGNOSIS — K029 Dental caries, unspecified: Secondary | ICD-10-CM | POA: Diagnosis not present

## 2015-02-11 DIAGNOSIS — Z8739 Personal history of other diseases of the musculoskeletal system and connective tissue: Secondary | ICD-10-CM | POA: Insufficient documentation

## 2015-02-11 DIAGNOSIS — E119 Type 2 diabetes mellitus without complications: Secondary | ICD-10-CM | POA: Diagnosis not present

## 2015-02-11 DIAGNOSIS — Z7984 Long term (current) use of oral hypoglycemic drugs: Secondary | ICD-10-CM | POA: Insufficient documentation

## 2015-02-11 DIAGNOSIS — K0889 Other specified disorders of teeth and supporting structures: Secondary | ICD-10-CM | POA: Diagnosis present

## 2015-02-11 DIAGNOSIS — I10 Essential (primary) hypertension: Secondary | ICD-10-CM | POA: Insufficient documentation

## 2015-02-11 DIAGNOSIS — Z7952 Long term (current) use of systemic steroids: Secondary | ICD-10-CM | POA: Diagnosis not present

## 2015-02-11 MED ORDER — PENICILLIN V POTASSIUM 500 MG PO TABS: 500.0000 mg | ORAL_TABLET | Freq: Four times a day (QID) | ORAL | Status: AC

## 2015-02-11 MED ORDER — IBUPROFEN 600 MG PO TABS: 600.0000 mg | ORAL_TABLET | Freq: Four times a day (QID) | ORAL | Status: DC | PRN

## 2015-02-11 NOTE — ED Notes (Signed)
DC instructions reviewed with pt, discussed pain control with use of ice pack and Ibuprofen Rx as written by EDP, Also discussed the importance of taking abx as prescribed by EDP as well. Provided list of dental services for pt, highlighted dental practices that can aid pt in his care as recommended by EDP. Opportunity for questions provided.

## 2015-02-11 NOTE — ED Notes (Signed)
Presents with dental pain at left lower tooth area, pt states has tried OTC medications without relief.

## 2015-02-11 NOTE — Discharge Instructions (Signed)
°Emergency Department Resource Guide °1) Find a Doctor and Pay Out of Pocket °Although you won't have to find out who is covered by your insurance plan, it is a good idea to ask around and get recommendations. You will then need to call the office and see if the doctor you have chosen will accept you as a new patient and what types of options they offer for patients who are self-pay. Some doctors offer discounts or will set up payment plans for their patients who do not have insurance, but you will need to ask so you aren't surprised when you get to your appointment. ° °2) Contact Your Local Health Department °Not all health departments have doctors that can see patients for sick visits, but many do, so it is worth a call to see if yours does. If you don't know where your local health department is, you can check in your phone book. The CDC also has a tool to help you locate your state's health department, and many state websites also have listings of all of their local health departments. ° °3) Find a Walk-in Clinic °If your illness is not likely to be very severe or complicated, you may want to try a walk in clinic. These are popping up all over the country in pharmacies, drugstores, and shopping centers. They're usually staffed by nurse practitioners or physician assistants that have been trained to treat common illnesses and complaints. They're usually fairly quick and inexpensive. However, if you have serious medical issues or chronic medical problems, these are probably not your best option. ° °No Primary Care Doctor: °- Call [facility] at  832-8000 - they can help you locate a primary care doctor that  accepts your insurance, provides certain services, etc. °- Physician Referral Service- 1-800-533-3463 ° °Chronic Pain Problems: °Organization         Address  Phone   Notes  °[facility]  (336) 297-2271 Patients need to be referred by their primary care doctor.  ° °Medication  Assistance: °Organization         Address  Phone   Notes  °[place] [facility] [address] °[place], [place] [address] (336) 641-8030 --Must be a resident of [place] °-- Must have NO insurance coverage whatsoever (no Medicaid/ Medicare, etc.) °-- The pt. MUST have a primary care doctor that directs their care regularly and follows them in the community °  °[facility]  (866) 331-1348   °[employer]  (888) 892-1162   ° °Agencies that provide inexpensive medical care: °Organization         Address  Phone   Notes  °[facility]  (336) 832-8035   °[facility]    (336) 832-7272   °[facility] [address] °[place], [place] [address] (336) 832-4777   °[facility] [address], °[place] (336) 271-4999   °[facility]    (336) 373-0678   °[facility]    (336) 272-1050   °[facility] ° [address], [place] Phone:  (336) 832-4444, Fax:  (336) 832-4440 Hours of Operation:  9 am - 6 pm, M-F.  Also accepts Medicaid/Medicare and self-pay.  °[facility] ° [address], [place] Phone: (336) 832-3150, Fax: (336) 832-3151. Hours of Operation:  8:30 am - 5:30 pm, M-F.  Also accepts Medicaid and self-pay.  °[facility] [place] [address], [place] Phone: (336) 878-6027   °[facility] [address], [place], [place] (336)723-1848, Ext. 123 Mondays & Thursdays: 7-9 AM.  First 15 patients are seen on a first come, first serve basis. °  ° °Medicaid-accepting [place] Providers: ° °Organization         Address  Phone   Notes  °[facility] [address], [place] (336) 641-2100 Also accepts self-pay patients.  °[facility] [address], [place] ° (336) 856-9996   °[facility] [address], [place]  (336) 288-8857   °[facility] [address] [place] [address], [place] (336) 299-7000   °Veita Bland [address], [place]  ° (336) 373-1557 Only accepts [place] Access Medicaid patients after they have their name applied to their card.  ° °Self-Pay (no insurance) in [place]: ° °Organization         Address  Phone   Notes  °Sickle Cell Patients, [facility] [address], [place] (336) 832-1970   °[facility] [address], [place] (336) 832-4400   °[facility] ° [address], [place] (336) 992-4800   °[facility]/Dr. Osei-Bonsu ° [address] [place] [address], [place] or [address], [place] (336) 841-8500 Phone number for both [place] and [place] locations is the same.  °[facility] [address], [place] (336) 299-0000   °[facility] [address] [place] [address], [place] or [address] (336) 852-7530 °(336) 878-2260   °[facility] [address], [place] (336) 350-1642, phone; (336) 294-5005, fax Sees patients 1st and 3rd Saturday of every month.  Must not qualify for public or private insurance (i.e. Medicaid, Medicare, [place] Health Choice, Veterans' Benefits) • Household income should be no more than 200% of the poverty level •The clinic cannot treat you if you are pregnant or think you are pregnant • Sexually transmitted diseases are not treated at the clinic.  ° ° °Dental Care: °Organization         Address  Phone  Notes  °[place] [facility] [address], [place] (336) 641-6152 Accepts children up to age 21 who are enrolled in Medicaid or [place] Health Choice; pregnant women with a Medicaid card; and children who have applied for Medicaid or [place] Health Choice, but were declined, whose parents can pay a reduced fee at time of service.  °[place]  [facility] [place]  [address], [place] (336) 641-7733 Accepts children up to age 21 who are enrolled in Medicaid or [place] Health Choice; pregnant women with a Medicaid card; and children who have applied for Medicaid or [place] Health Choice, but were declined, whose parents can pay a reduced fee at time of service.  °[facility] ° [address],  (336) 641-4533 Patients are seen by appointment only. Walk-ins are not accepted. [facility] will see patients 18 years of age and older. °Monday - Tuesday (8am-5pm) °Most Wednesdays (8:30-5pm) °$30 per visit, cash only  °[facility] ° [address], [place] (336) 641-4533 Patients are seen by appointment only. Walk-ins are not accepted. [facility] will see patients 18 years of age and older. °One   Wednesday Evening (Monthly: Volunteer Based).  $30 per visit, cash only  °[facility]  (919) 537-3737 for adults; Children under age 4, call Graduate Pediatric Dentistry at (919) [phone]. Children aged 4-14, please call (919) [phone] to request a pediatric application. ° Dental services are provided in all areas of dental care including fillings, crowns and bridges, complete and partial dentures, implants, gum treatment, root canals, and extractions. Preventive care is also provided. Treatment is provided to both adults and children. °Patients are selected via a lottery and there is often a waiting list. °  °[facility] [address], °[place] ° (336) 763-8833 [email or web address] °  °[facility] [address], [place], [place] (336)723-1848, Ext. 123 Second and Fourth Thursday of each month, opens at 6:30 AM; Clinic ends at 9 AM.  Patients are seen on a first-come first-served basis, and a limited number are seen during each clinic.  ° °[facility] ° [address], [place], [place] (336) 723-7904    Eligibility Requirements °You must have lived in [place], [place], or [place] counties for at least the last three months. °  You cannot be eligible for state or federal sponsored healthcare insurance, including Veterans Administration, Medicaid, or Medicare. °  You generally cannot be eligible for healthcare insurance through your employer.  °  How to apply: °Eligibility screenings are held every Tuesday and Wednesday afternoon from 1:00 pm until 4:00 pm. You do not need an appointment for the interview!  °[facility] [address], [place], [place] 336-631-2330   °[facility]  336-342-8273   °[facility]  336-703-3100   °[facility]  336-570-6415   ° °Behavioral Health Resources in the Community: °Intensive Outpatient Programs °Organization         Address  Phone  Notes  °[facility] [address], [place], [place] 336-878-6098   °[facility] [address], [place], [place] 336-832-9800   °[facility] [address], [place], [place] ° 336-882-2125   °[facility] [address],  °[place], [place] 1-800-853-5163 or 336-641-4981   °Substance Abuse Resources °Organization         Address  Phone  Notes  °[facility]  336-882-2125   °[facility]  336-784-9470   °[facility]  336-285-9073   °[facility]  336-845-3988   °Residential & Outpatient Substance Abuse Program  1-800-659-3381   °Psychological Services °Organization         Address  Phone  Notes  ° Health  336- 832-9600   °[facility]  336- 378-7881   °[facility] [address], [place] 1-800-853-5163 or 336-641-4981   ° °Mobile Crisis Teams °Organization         Address  Phone  Notes  °[facility], Mobile Crisis Care Unit  1-877-626-1772   °Assertive °[facility] ° [address]  [place], [place] 336-834-9664   °Sharon DeEsch [address] °[place] 336-554-5454   ° °Self-Help/Support Groups °Organization         Address  Phone             Notes  °[facility] - variety of support groups  336- 373-1402 Call for more information  °Narcotics Anonymous (NA), [facility] [address], °[place]  2 meetings at this location  ° °  Residential Treatment Programs °Organization         Address  Phone  Notes  °[facility] [address],    °[place]  1-866-801-8205   °[facility] ° [address], [place], [place] 704-293-8524   °[facility] [address], [place] 336-845-3988 Admissions: 8am-3pm M-F  °[facility] [address]    °[place], [place] 336-841-1104   °[facility] [address], [place], [place] 336-379-7146   °[facility] [address]  °[place], [place] 336-285-9073   °[facility] - Intensive Outpatient [address], [place], [place] 336-852-3033   °[facility] [address]  °[place], [place] 1-877-615-2722 or 336-784-9470   °[facility] [address] [place], [place] 336-227-7417 Accepts Medicaid  °[facility] [address]  ° [place] 1-800-659-3381 Substance Abuse/Addiction Treatment  ° °[place] Behavioral Health Resources °Organization         Address  Phone  Notes  °[facility]  (888) 581-9988   °Julie Brannon, PhD [address] [place], [place]   (336) 349-5553 or (336) 951-0000   °[facility]   [address] °[place], [place] (336) 349-4454   °[facility] [address], [place], [place] (336) 342-8316 Insurance/Medicaid/sponsorship through [facility]  °[facility] [address]                                    [place], [place] (336) 342-8316 Therapy/tele-psych/case    °[facility] [address]  ° [place], [place] (336) 349-2233    °Dr. Arfeen  (336) 349-4544   °[facility] [place]  [employer] [place] [facility] 1) [address], [place] °2) [address], [place] °3)  [address], [place] (336) 349-3220 °(336) 342-7768 ° °(336) 342-8140   °[place] [facility] (336) 342-1394 or (336) 342-3537 (After Hours)    ° ° °

## 2015-02-11 NOTE — ED Notes (Signed)
Ice pack provided for pain control

## 2015-02-11 NOTE — ED Notes (Signed)
States two days ago began having left lower tooth ache

## 2015-02-11 NOTE — ED Notes (Signed)
MD at bedside. 

## 2015-02-11 NOTE — ED Provider Notes (Signed)
CSN: [id number]     Arrival date & time 02/11/15  [id number] History   First MD Initiated Contact with Patient 02/11/15 0957     Chief Complaint  Patient presents with  . Dental Pain     (Consider location/radiation/quality/duration/timing/severity/associated sxs/prior Treatment) Patient is a 60 y.o. male presenting with tooth pain. The history is provided by the patient.  Dental Pain Location:  Lower Lower teeth location:  17/LL 3rd molar Quality:  Dull and aching Severity:  Moderate Onset quality:  Gradual Duration:  2 days Timing:  Constant Progression:  Worsening Chronicity:  New Context: dental caries   Relieved by:  Nothing Worsened by:  Nothing tried Ineffective treatments:  None tried Associated symptoms: no fever, no neck pain, no neck swelling, no oral lesions and no trismus   Risk factors: lack of dental care     Past Medical History  Diagnosis Date  . Hypertension   . Arthritis   . Diabetes mellitus   . High cholesterol    Past Surgical History  Procedure Laterality Date  . Knee arthroscopy    . Rotator cuff repair    . Elbow surgery     No family history on file. Social History  Substance Use Topics  . Smoking status: Never Smoker   . Smokeless tobacco: Never Used  . Alcohol Use: Yes     Comment: occasional    Review of Systems  Constitutional: Negative for fever.  HENT: Negative for mouth sores.   Musculoskeletal: Negative for neck pain.  All other systems reviewed and are negative.     Allergies  Review of patient's allergies indicates no known allergies.  Home Medications   Prior to Admission medications   Medication Sig Start Date End Date Taking? Authorizing Provider  metFORMIN (GLUCOPHAGE) 500 MG tablet Take 500 mg by mouth 2 (two) times daily with a meal.    Historical Provider, MD  triamcinolone cream (KENALOG) 0.1 % Apply 1 application topically 2 (two) times daily. 08/08/14   Carlisle Cater, PA-C   BP 148/104 mmHg  Pulse 108   Temp(Src) 98.2 F (36.8 C) (Oral)  Resp 20  SpO2 100% Physical Exam  Constitutional: He is oriented to person, place, and time. He appears well-developed and well-nourished. No distress.  HENT:  Head: Normocephalic and atraumatic.  Mouth/Throat: Dental caries (with tenderness at base of tooth) present.    Eyes: Conjunctivae are normal.  Neck: Neck supple. No tracheal deviation present.  Cardiovascular: Normal rate and regular rhythm.   Pulmonary/Chest: Effort normal. No respiratory distress.  Abdominal: Soft. He exhibits no distension.  Neurological: He is alert and oriented to person, place, and time.  Skin: Skin is warm and dry.  Psychiatric: He has a normal mood and affect.    ED Course  Procedures (including critical care time) Labs Review Labs Reviewed - No data to display  Imaging Review No results found. I have personally reviewed and evaluated these images and lab results as part of my medical decision-making.   EKG Interpretation None      MDM   Final diagnoses:  Pain due to dental caries    60 y.o. male presents with dental pain for 2 days. After thorough assessment of mouth and teeth, Pt appears to have dental caries with abscess that is not approachable via gingival I&D. No evidence of extension into mandible/maxilla, no ludwig's angina, no systemic symptoms to suggest sepsis or endocarditis. Pt will be placed on antimicrobials and offered alveolar block for comfort in  interim. Pt was advised that definitive care for dental abscess is with a dental health professional.     Leo Grosser, MD 02/11/15 1008

## 2015-02-11 NOTE — ED Notes (Signed)
Noted to have some mild facial swelling on left side of face

## 2018-12-08 ENCOUNTER — Other Ambulatory Visit: Payer: Self-pay

## 2018-12-08 ENCOUNTER — Encounter (HOSPITAL_BASED_OUTPATIENT_CLINIC_OR_DEPARTMENT_OTHER): Payer: Self-pay

## 2018-12-08 ENCOUNTER — Emergency Department (HOSPITAL_BASED_OUTPATIENT_CLINIC_OR_DEPARTMENT_OTHER)
Admission: EM | Admit: 2018-12-08 | Discharge: 2018-12-08 | Disposition: A | Discharge status: Home / Self Care | Payer: Medicare Other | Attending: Emergency Medicine | Admitting: Emergency Medicine

## 2018-12-08 DIAGNOSIS — I10 Essential (primary) hypertension: Secondary | ICD-10-CM | POA: Diagnosis not present

## 2018-12-08 DIAGNOSIS — Z7984 Long term (current) use of oral hypoglycemic drugs: Secondary | ICD-10-CM | POA: Insufficient documentation

## 2018-12-08 DIAGNOSIS — U071 COVID-19: Secondary | ICD-10-CM | POA: Diagnosis not present

## 2018-12-08 DIAGNOSIS — Z8546 Personal history of malignant neoplasm of prostate: Secondary | ICD-10-CM | POA: Insufficient documentation

## 2018-12-08 DIAGNOSIS — E119 Type 2 diabetes mellitus without complications: Secondary | ICD-10-CM | POA: Diagnosis not present

## 2018-12-08 DIAGNOSIS — R05 Cough: Secondary | ICD-10-CM | POA: Diagnosis present

## 2018-12-08 HISTORY — DX: Malignant neoplasm of prostate: C61

## 2018-12-08 LAB — SARS CORONAVIRUS 2 AG (30 MIN TAT): SARS Coronavirus 2 Ag: POSITIVE — AB

## 2018-12-08 NOTE — ED Notes (Signed)
Family member of pt reports live with sister who tested positive for covid yesterday.

## 2018-12-08 NOTE — ED Notes (Signed)
Patient stated that he is coughing copious amount of thick secretions.  Unable to assess.  Did not observe him coughing.

## 2018-12-08 NOTE — ED Triage Notes (Addendum)
Pt c/o flu like sx x 2 weeks-neg covid test ~1 month ago-NAD-steady gait

## 2018-12-08 NOTE — ED Provider Notes (Signed)
[facility] EMERGENCY DEPARTMENT Provider Note   CSN: [id number] Arrival date & time: 12/08/18  1202     History   Chief Complaint Chief Complaint  Patient presents with  . Cough    HPI Tyler Pope is a 63 y.o. male with a history of diabetes, high cholesterol, hypertension, presented to emergency room with a viral syndrome.  Patient reports 2 weeks of general malaise, cough, diminished appetite, diminished smell and taste sensation, mild nausea.  He denies any vomiting or diarrhea.  He denies any fevers or chills.  He reports that he lives with his 2 sisters, one of them tested positive for Covid yesterday, and the other 1 is currently in the emergency department with respiratory symptoms.  He is here for Covid testing.  He denies abdominal pain or chest pain.  He denies any new or worsening dyspnea on exertion or shortness of breath.  No known lung disease or asthma/COPD  He has no known drug allergies.     HPI  Past Medical History:  Diagnosis Date  . Arthritis   . Diabetes mellitus   . High cholesterol   . Hypertension   . Prostate cancer ([place])     There are no active problems to display for this patient.   Past Surgical History:  Procedure Laterality Date  . ELBOW SURGERY    . KNEE ARTHROSCOPY    . PROSTATE SURGERY    . ROTATOR CUFF REPAIR          Home Medications    Prior to Admission medications   Medication Sig Start Date End Date Taking? Authorizing Provider  ibuprofen (ADVIL,MOTRIN) 600 MG tablet Take 1 tablet (600 mg total) by mouth every 6 (six) hours as needed. 02/11/15   Leo Grosser, MD  metFORMIN (GLUCOPHAGE) 500 MG tablet Take 500 mg by mouth 2 (two) times daily with a meal.    [provider]  triamcinolone cream (KENALOG) 0.1 % Apply 1 application topically 2 (two) times daily. 08/08/14   Carlisle Cater, PA-C  omeprazole (PRILOSEC) 40 MG capsule Take 1 capsule (40 mg total) by mouth daily. 03/12/14 08/08/14  Malvin Johns, MD  sucralfate (CARAFATE) 1 G tablet Take 1 tablet (1 g total) by mouth 4 (four) times daily -  with meals and at bedtime. 03/12/14 08/08/14  Malvin Johns, MD    Family History No family history on file.  Social History Social History   Tobacco Use  . Smoking status: Never Smoker  . Smokeless tobacco: Never Used  . Tobacco comment: smoked some in his 41s  Substance Use Topics  . Alcohol use: Yes    Comment: occ  . Drug use: No     Allergies   Patient has no known allergies.   Review of Systems Review of Systems  Constitutional: Positive for appetite change and fatigue. Negative for chills and fever.  HENT: Positive for sore throat. Negative for ear pain.   Respiratory: Positive for cough. Negative for shortness of breath.   Cardiovascular: Negative for chest pain and palpitations.  Gastrointestinal: Positive for nausea. Negative for abdominal pain and vomiting.  Musculoskeletal: Positive for arthralgias and myalgias.  Neurological: Positive for headaches. Negative for seizures, syncope and light-headedness.  All other systems reviewed and are negative.    Physical Exam Updated Vital Signs BP 116/79 (BP Location: Right Arm)   Pulse 74   Temp 97.6 F (36.4 C) (Oral)   Resp 16   Ht 5\' 9"  (1.753 m)  Wt 74.4 kg   SpO2 96%   BMI 24.22 kg/m   Physical Exam Vitals signs and nursing note reviewed.  Constitutional:      Appearance: He is well-developed.  HENT:     Head: Normocephalic and atraumatic.     Mouth/Throat:     Mouth: Mucous membranes are moist.     Pharynx: Oropharynx is clear. Uvula midline. No pharyngeal swelling, oropharyngeal exudate or uvula swelling.     Tonsils: No tonsillar exudate or tonsillar abscesses.     Comments: Cervical lymphadenopathy Eyes:     Conjunctiva/sclera: Conjunctivae normal.  Neck:     Musculoskeletal: Neck supple.  Cardiovascular:     Rate and Rhythm: Normal rate and regular rhythm.     Pulses: Normal pulses.      Heart sounds: No murmur.  Pulmonary:     Effort: Pulmonary effort is normal. No respiratory distress.     Breath sounds: Normal breath sounds.  Abdominal:     General: There is no distension.     Palpations: Abdomen is soft.     Tenderness: There is no abdominal tenderness.  Skin:    General: Skin is warm and dry.  Neurological:     Mental Status: He is alert.  Psychiatric:        Mood and Affect: Mood normal.        Behavior: Behavior normal.      ED Treatments / Results  Labs (all labs ordered are listed, but only abnormal results are displayed) Labs Reviewed  SARS CORONAVIRUS 2 AG (30 MIN TAT) - Abnormal; Notable for the following components:      Result Value   SARS Coronavirus 2 Ag POSITIVE (*)    All other components within normal limits    EKG None  Radiology No results found.  Procedures Procedures (including critical care time)  Medications Ordered in ED Medications - No data to display   Initial Impression / Assessment and Plan / ED Course  I have reviewed the triage vital signs and the nursing notes.  Pertinent labs & imaging results that were available during my care of the patient were reviewed by me and considered in my medical decision making (see chart for details).   63 yo male here with viral type syndrome x 2 weeks. Clinical exam he is quite well-appearing.  There is no hypoxia per his vitals, his lungs are clear to auscultation bilaterally.  I do not suspect bacterial pneumonia or ARDS at this time.  He is stable on room air.  His symptoms are consistent with COVID-19, particularly with his known positive contacts in his house.  We will test him for it, and I believe he was stable for discharge.  Advised him to keep self hydrated at home, and continue making an effort to eat.  He does not appear clinically dehydrated at this time.  Tyler Pope was evaluated in Emergency Department on 12/08/2018 for the symptoms described in the  history of present illness. He was evaluated in the context of the global COVID-19 pandemic, which necessitated consideration that the patient might be at risk for infection with the SARS-CoV-2 virus that causes COVID-19. Institutional protocols and algorithms that pertain to the evaluation of patients at risk for COVID-19 are in a state of rapid change based on information released by regulatory bodies including the CDC and federal and state organizations. These policies and algorithms were followed during the patient's care in the ED.   Final Clinical Impressions(s) / ED  Diagnoses   Final diagnoses:  [id number]    ED Discharge Orders    None       Wyvonnia Dusky, MD 12/08/18 [phone]

## 2019-03-22 ENCOUNTER — Ambulatory Visit: Payer: Medicare Other | Attending: Internal Medicine

## 2019-03-22 DIAGNOSIS — Z23 Encounter for immunization: Secondary | ICD-10-CM | POA: Insufficient documentation

## 2019-03-22 NOTE — Progress Notes (Signed)
   Covid-19 Vaccination Clinic  Name:  Tyler Pope    MRN: [id number] DOB: [birth year]  03/22/2019  [name] was observed post Covid-19 immunization for 15 minutes without incident. He was provided with Vaccine Information Sheet and instruction to access the V-Safe system.   [name] was instructed to call 911 with any severe reactions post vaccine: Marland Kitchen Difficulty breathing  . Swelling of face and throat  . A fast heartbeat  . A bad rash all over body  . Dizziness and weakness   Immunizations Administered    Name Date Dose VIS Date Route   Pfizer COVID-19 Vaccine 03/22/2019 11:47 AM 0.3 mL 12/25/2018 Intramuscular   Manufacturer: [place]   Lot: [id number]   [place]: [id number]

## 2019-04-21 ENCOUNTER — Ambulatory Visit: Payer: Medicare Other | Attending: Internal Medicine

## 2019-04-21 DIAGNOSIS — Z23 Encounter for immunization: Secondary | ICD-10-CM

## 2019-04-21 NOTE — Progress Notes (Signed)
   Covid-19 Vaccination Clinic  Name:  Jennis Botte    MRN: [id number] DOB: [birth year]  04/21/2019  Mr. Coady was observed post Covid-19 immunization for 15 minutes without incident. He was provided with Vaccine Information Sheet and instruction to access the V-Safe system.   Mr. Atondo was instructed to call 911 with any severe reactions post vaccine: Marland Kitchen Difficulty breathing  . Swelling of face and throat  . A fast heartbeat  . A bad rash all over body  . Dizziness and weakness   Immunizations Administered    Name Date Dose VIS Date Route   Pfizer COVID-19 Vaccine 04/21/2019 11:26 AM 0.3 mL 12/25/2018 Intramuscular   Manufacturer: [employer]   Lot: [id number]   [place]: [id number]

## 2019-05-04 ENCOUNTER — Emergency Department (HOSPITAL_BASED_OUTPATIENT_CLINIC_OR_DEPARTMENT_OTHER): Payer: Medicare Other

## 2019-05-04 ENCOUNTER — Emergency Department (HOSPITAL_BASED_OUTPATIENT_CLINIC_OR_DEPARTMENT_OTHER)
Admission: EM | Admit: 2019-05-04 | Discharge: 2019-05-04 | Disposition: A | Discharge status: Home / Self Care | Payer: Medicare Other | Attending: Emergency Medicine | Admitting: Emergency Medicine

## 2019-05-04 ENCOUNTER — Other Ambulatory Visit: Payer: Self-pay

## 2019-05-04 ENCOUNTER — Encounter (HOSPITAL_BASED_OUTPATIENT_CLINIC_OR_DEPARTMENT_OTHER): Payer: Self-pay

## 2019-05-04 DIAGNOSIS — Z79899 Other long term (current) drug therapy: Secondary | ICD-10-CM | POA: Insufficient documentation

## 2019-05-04 DIAGNOSIS — E782 Mixed hyperlipidemia: Secondary | ICD-10-CM | POA: Diagnosis not present

## 2019-05-04 DIAGNOSIS — Z8546 Personal history of malignant neoplasm of prostate: Secondary | ICD-10-CM | POA: Insufficient documentation

## 2019-05-04 DIAGNOSIS — M4802 Spinal stenosis, cervical region: Secondary | ICD-10-CM | POA: Insufficient documentation

## 2019-05-04 DIAGNOSIS — M436 Torticollis: Secondary | ICD-10-CM | POA: Insufficient documentation

## 2019-05-04 DIAGNOSIS — E119 Type 2 diabetes mellitus without complications: Secondary | ICD-10-CM | POA: Diagnosis not present

## 2019-05-04 DIAGNOSIS — I10 Essential (primary) hypertension: Secondary | ICD-10-CM | POA: Diagnosis not present

## 2019-05-04 DIAGNOSIS — M542 Cervicalgia: Secondary | ICD-10-CM | POA: Diagnosis present

## 2019-05-04 NOTE — ED Notes (Signed)
ED Provider at bedside discussing test results and dispo plan of care. 

## 2019-05-04 NOTE — ED Provider Notes (Signed)
[facility] Provider Note   CSN: [id number] Arrival date & time: 05/04/19  1306     History Chief Complaint  Patient presents with  . Neck Pain    Tyler Pope is a 64 y.o. male.  He is complaining of neck pain and spasm that is been going on for few weeks worse over the last 3 days.  He said he has had this in the past but never lasted this long.  He went to [facility] twice and was given steroids Toradol muscle relaxants and hydrocodone.  He continues to have the pain does not states any better.  Increased pain with any type of movement.  No fevers chills blurry vision double vision numbness or weakness.  No recent trauma.  Does not have a primary care doctor.  The history is provided by the patient.  Neck Pain Pain location:  Occipital region and R side Quality:  Stabbing Pain radiates to:  R shoulder Pain severity:  Severe Pain is:  Same all the time Onset quality:  Gradual Timing:  Intermittent Progression:  Unchanged Chronicity:  Recurrent Context: not recent injury   Relieved by:  Nothing Worsened by:  Bending and twisting Ineffective treatments:  Muscle relaxants and NSAIDs Associated symptoms: no bladder incontinence, no bowel incontinence, no chest pain, no fever, no numbness, no paresis, no photophobia, no tingling, no visual change and no weakness   Risk factors: no recent head injury and no recurrent falls        Past Medical History:  Diagnosis Date  . Arthritis   . Diabetes mellitus   . High cholesterol   . Hypertension   . Prostate cancer ([place])     There are no problems to display for this patient.   Past Surgical History:  Procedure Laterality Date  . ELBOW SURGERY    . KNEE ARTHROSCOPY    . PROSTATE SURGERY    . ROTATOR CUFF REPAIR         No family history on file.  Social History   Tobacco Use  . Smoking status: Never Smoker  . Smokeless tobacco: Never Used  . Tobacco comment: smoked some  in his 41s  Substance Use Topics  . Alcohol use: Yes    Comment: occ  . Drug use: No    Home Medications Prior to Admission medications   Medication Sig Start Date End Date Taking? Authorizing Provider  atorvastatin (LIPITOR) 80 MG tablet Take 80 mg by mouth at bedtime. 01/31/19   [provider]  cyclobenzaprine (FLEXERIL) 10 MG tablet Take 10 mg by mouth 2 (two) times daily as needed. 05/02/19   [provider]  donepezil (ARICEPT) 10 MG tablet Take 10 mg by mouth at bedtime. 01/31/19   [provider]  HYDROcodone-acetaminophen (NORCO/VICODIN) 5-325 MG tablet Take 1 tablet by mouth every 4 (four) hours as needed. 05/03/19   [provider]  lisinopril (ZESTRIL) 20 MG tablet Take 20 mg by mouth daily. 01/31/19   [provider]  metFORMIN (GLUCOPHAGE) 500 MG tablet Take 500 mg by mouth 2 (two) times daily with a meal.    [provider]  naproxen (NAPROSYN) 500 MG tablet Take 500 mg by mouth 2 (two) times daily. 05/02/19   [provider]  oxybutynin (DITROPAN-XL) 5 MG 24 hr tablet Take 5 mg by mouth daily. 04/22/19   [provider]  predniSONE (STERAPRED UNI-PAK 21 TAB) 10 MG (21) TBPK tablet  05/03/19   [provider]  omeprazole (PRILOSEC) 40 MG capsule Take 1 capsule (40 mg total) by mouth daily. 03/12/14 08/08/14  Malvin Johns, MD  sucralfate (CARAFATE) 1 G tablet Take 1 tablet (1 g total) by mouth 4 (four) times daily -  with meals and at bedtime. 03/12/14 08/08/14  Malvin Johns, MD    Allergies    Patient has no known allergies.  Review of Systems   Review of Systems  Constitutional: Negative for fever.  HENT: Negative for sore throat.   Eyes: Negative for photophobia.  Respiratory: Negative for shortness of breath.   Cardiovascular: Negative for chest pain.  Gastrointestinal: Negative for abdominal pain and bowel incontinence.  Genitourinary: Negative for bladder incontinence and dysuria.    Musculoskeletal: Positive for neck pain.  Skin: Negative for rash.  Neurological: Negative for tingling, weakness and numbness.    Physical Exam Updated Vital Signs BP 138/83 (BP Location: Left Arm)   Pulse 98   Temp 98.1 F (36.7 C) (Oral)   Resp 16   Ht 5\' 9"  (1.753 m)   Wt 81.6 kg   SpO2 99%   BMI 26.58 kg/m   Physical Exam Vitals and nursing note reviewed.  Constitutional:      Appearance: He is well-developed.  HENT:     Head: Normocephalic and atraumatic.  Eyes:     Conjunctiva/sclera: Conjunctivae normal.  Neck:     Comments: Is diffuse tenderness throughout his paracervical muscles right greater than left.  Limited range of motion secondary to spasm.  No pulsatile masses appreciated. Cardiovascular:     Rate and Rhythm: Normal rate and regular rhythm.     Heart sounds: No murmur.  Pulmonary:     Effort: Pulmonary effort is normal. No respiratory distress.     Breath sounds: Normal breath sounds.  Abdominal:     Palpations: Abdomen is soft.     Tenderness: There is no abdominal tenderness.  Musculoskeletal:        General: No deformity or signs of injury.     Cervical back: Tenderness present.  Skin:    General: Skin is warm and dry.     Capillary Refill: Capillary refill takes less than 2 seconds.  Neurological:     General: No focal deficit present.     Mental Status: He is alert.     Cranial Nerves: No cranial nerve deficit.     Sensory: No sensory deficit.     Motor: No weakness.     Gait: Gait normal.     ED Results / Procedures / Treatments   Labs (all labs ordered are listed, but only abnormal results are displayed) Labs Reviewed - No data to display  EKG None  Radiology CT Cervical Spine Wo Contrast  Result Date: 05/04/2019 CLINICAL DATA:  Right-sided neck pain radiating into the right arm for the past 3 days. No injury. EXAM: CT CERVICAL SPINE WITHOUT CONTRAST TECHNIQUE: Multidetector CT imaging of the cervical spine was performed  without intravenous contrast. Multiplanar CT image reconstructions were also generated. COMPARISON:  None. FINDINGS: Alignment: Trace facet mediated anterolisthesis from C3-C4 through C5-C6. Skull base and vertebrae: No acute fracture. No primary bone lesion or focal pathologic process. Soft tissues and spinal canal: No prevertebral fluid or swelling. No visible canal hematoma. Disc levels: Mild disc height loss at C4-C5. Moderate to severe disc height loss from C5-C6 through T1-T2. Moderate to severe bilateral facet arthropathy from C2-C3 through C5-C6. Advanced uncovertebral hypertrophy throughout the thoracic spine. Severe bilateral neuroforaminal stenosis  at C3-C4. Moderate neuroforaminal stenosis on the left at C2-C3, C4-C5, and C6-C7, and on the right at C5-C6. Moderate left and mild right neuroforaminal stenosis at T1-T2. No significant spinal canal stenosis. Upper chest: Negative. Other: None. IMPRESSION: 1. Advanced multilevel degenerative changes throughout the cervical spine. Severe bilateral neuroforaminal stenosis at C3-C4. 2. No acute osseous abnormality. Electronically Signed   By: Titus Dubin M.D.   On: 05/04/2019 14:56    Procedures Procedures (including critical care time)  Medications Ordered in ED Medications - No data to display  ED Course  I have reviewed the triage vital signs and the nursing notes.  Pertinent labs & imaging results that were available during my care of the patient were reviewed by me and considered in my medical decision making (see chart for details).  Clinical Course as of May 04 1719  Tue May 04, 2019  1515 Patient CT showing a lot of degenerative and arthritic changes.  I reviewed this with him.  He is on maximum medical management I think without having any neuro symptoms he does not need to immediately see neurosurgery.  I do recommend that he follow-up as an outpatient with them.  We will give him the number for the clinic.   [MB]    Clinical  Course User Index [MB] Hayden Rasmussen, MD   MDM Rules/Calculators/A&P                      Differential diagnosis includes torticollis, muscle spasm, arthritic changes, skipped facet, arthritis, spinal stenosis, cervical disc disease Final Clinical Impression(s) / ED Diagnoses Final diagnoses:  Torticollis  Degenerative cervical spinal stenosis    Rx / DC Orders ED Discharge Orders    None       Hayden Rasmussen, MD 05/04/19 1721

## 2019-05-04 NOTE — ED Notes (Signed)
Pt transported to XR.  

## 2019-05-04 NOTE — ED Triage Notes (Signed)
Pt arrives with reports of neck pain X3 days on the right side states he is unable to turn his neck and pain goes into his right armand head.

## 2019-05-04 NOTE — ED Triage Notes (Signed)
Pt reports he has had this in the past, states he was seen at [facility] yesterday and given some pills and shots but states they did not help.

## 2019-05-04 NOTE — Discharge Instructions (Addendum)
You were seen in the emergency department for continued neck pain.  You had a CAT scan that showed significant degenerative changes in your cervical spine.  Please continue your regular medications and schedule a follow-up with neurosurgery for further evaluation.  If you experience any new weakness or numbness return to the emergency department.

## 2019-05-06 ENCOUNTER — Other Ambulatory Visit (HOSPITAL_BASED_OUTPATIENT_CLINIC_OR_DEPARTMENT_OTHER): Payer: Self-pay | Admitting: Neurological Surgery

## 2019-05-06 DIAGNOSIS — M5412 Radiculopathy, cervical region: Secondary | ICD-10-CM

## 2019-05-08 ENCOUNTER — Other Ambulatory Visit: Payer: Self-pay

## 2019-05-08 ENCOUNTER — Ambulatory Visit (HOSPITAL_BASED_OUTPATIENT_CLINIC_OR_DEPARTMENT_OTHER)
Admission: RE | Admit: 2019-05-08 | Discharge: 2019-05-08 | Disposition: A | Discharge status: Home / Self Care | Payer: Medicare Other | Source: Ambulatory Visit | Attending: Neurological Surgery | Admitting: Neurological Surgery

## 2019-05-08 DIAGNOSIS — M5412 Radiculopathy, cervical region: Secondary | ICD-10-CM | POA: Diagnosis not present

## 2020-10-31 IMAGING — MR MR CERVICAL SPINE W/O CM
4 of 6 series · 28 of 48 positions shown · non-contrast
Comparison: CT 05/04/2019

CLINICAL DATA: Neck pain. Bilateral shoulder pain over the last 3
weeks. Cervical radiculopathy.

EXAM:
MRI CERVICAL SPINE WITHOUT CONTRAST
TECHNIQUE: Multiplanar, multisequence MR imaging of the cervical spine was
performed. No intravenous contrast was administered.

[Series 2: (id) tse sag · sagittal · 3.0mm · 0.41mm/px · 5 of 13 slices shown]
[im 1/13]
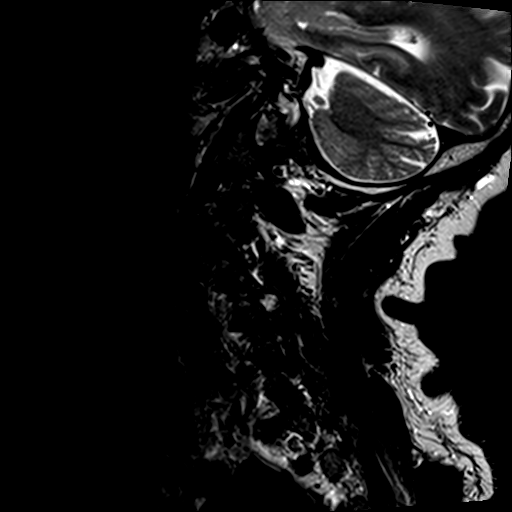
[im 3/13]
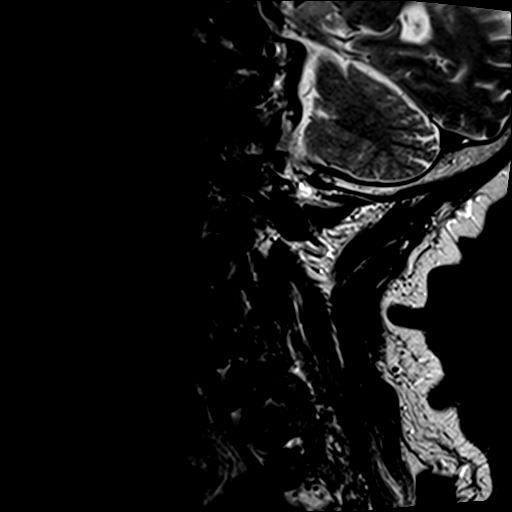
[im 5/13]
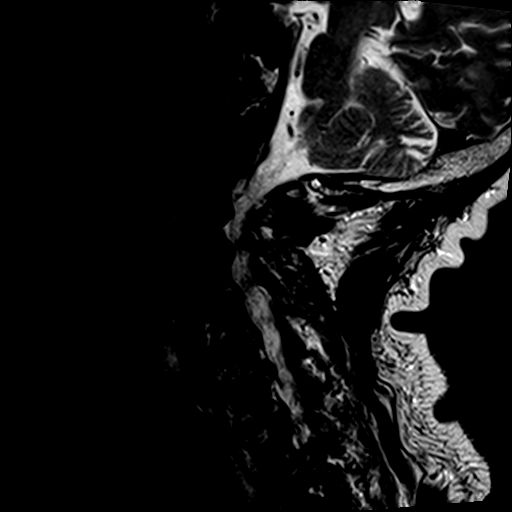
[im 8/13]
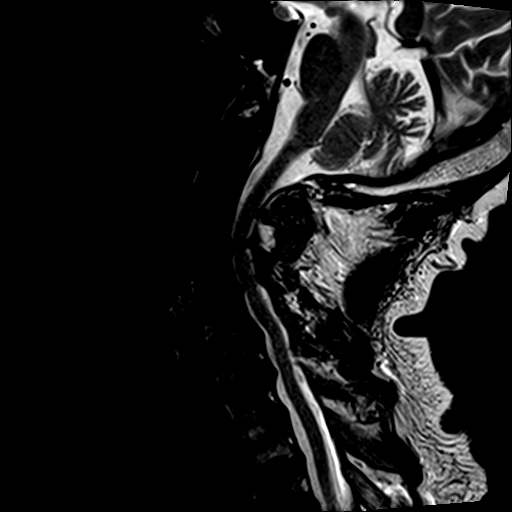
[im 13/13]
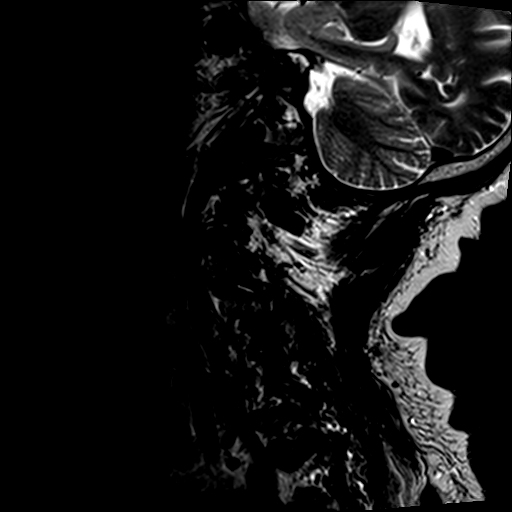

[Series 4: STIR · sagittal · 3.0mm · 0.82mm/px · 5 of 13 slices shown]
[im 1/13]
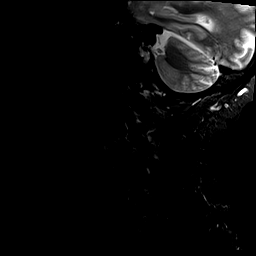
[im 4/13]
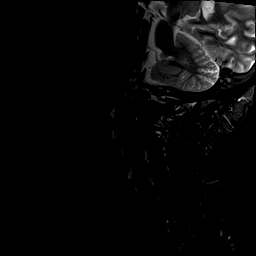
[im 7/13]
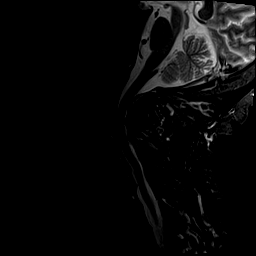
[im 10/13]
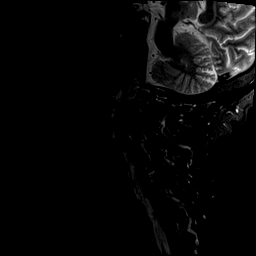
[im 13/13]
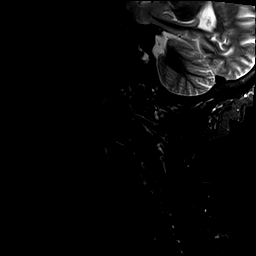

[Series 5: T2 · oblique · 3.0mm · 0.39mm/px · 9 of 32 slices shown (1 of 2)]
[im 1/32]
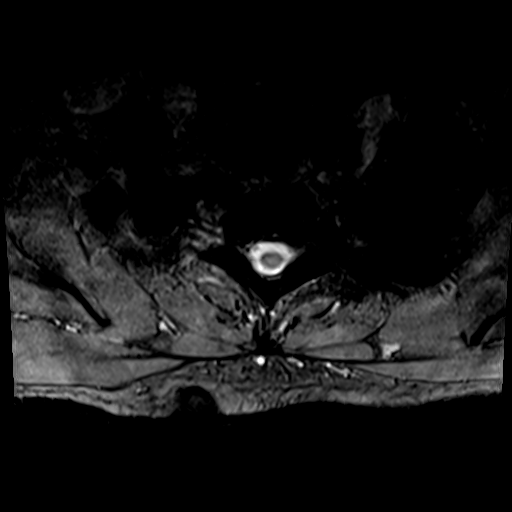
[im 6/32]
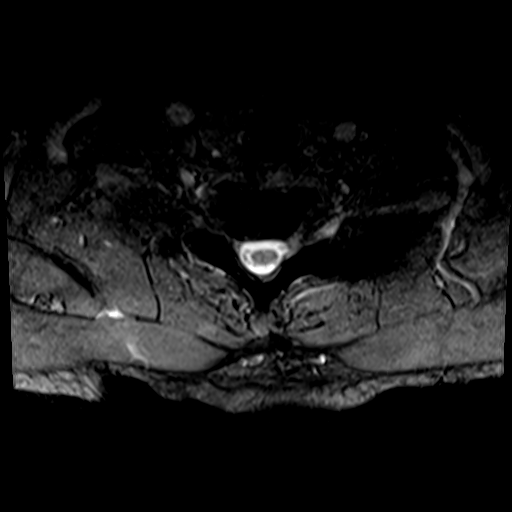
[im 11/32]
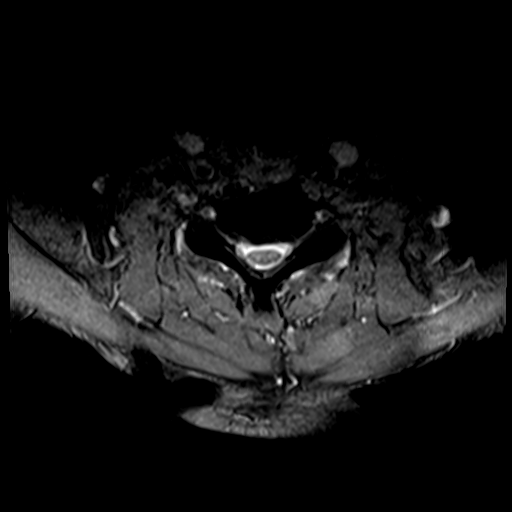
[im 13/32]
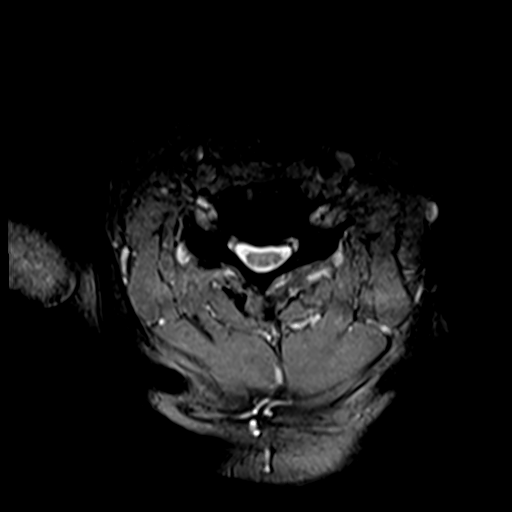
[im 16/32]
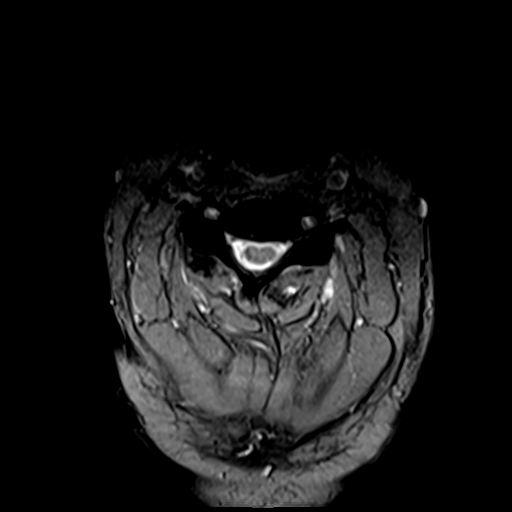
[im 19/32]
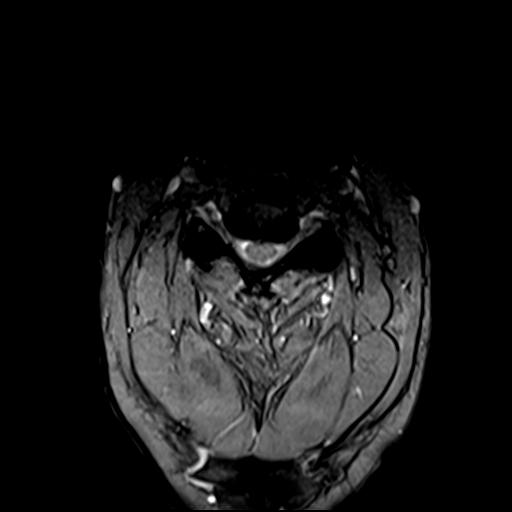
[im 21/32]
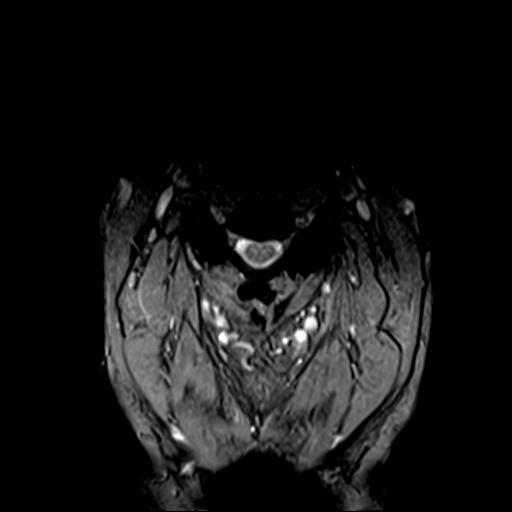
[im 26/32]
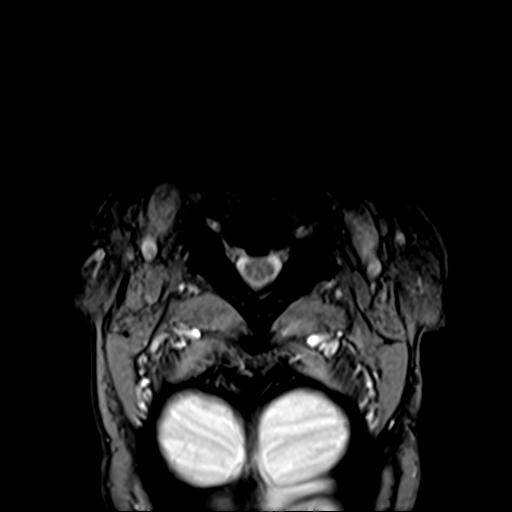
[im 32/32]
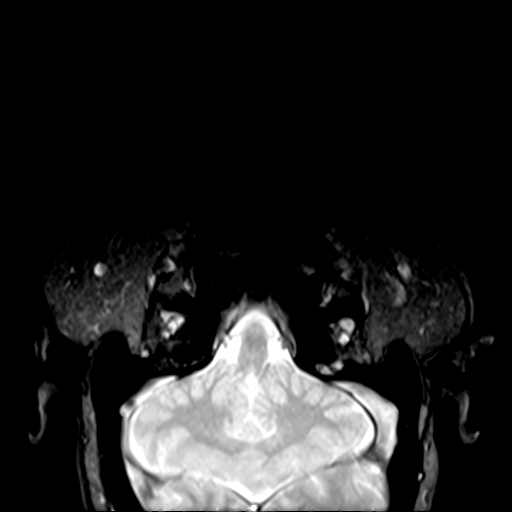

[Series 6: T2 · oblique · 3.0mm · 0.78mm/px · 9 of 32 slices shown (2 of 2)]
[im 1/32]
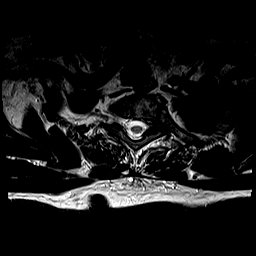
[im 6/32]
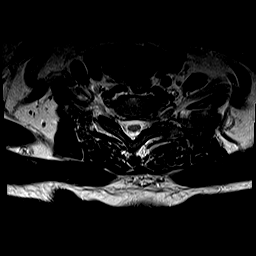
[im 11/32]
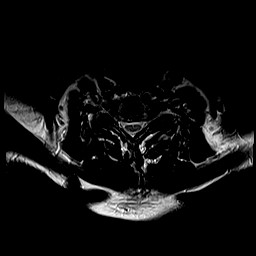
[im 13/32]
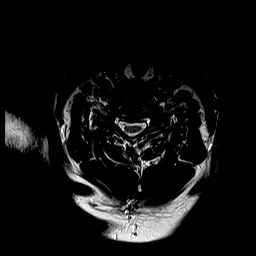
[im 16/32]
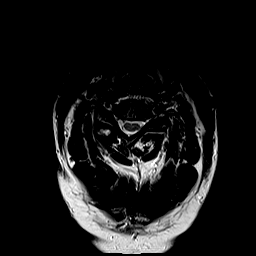
[im 19/32]
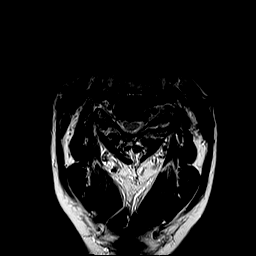
[im 21/32]
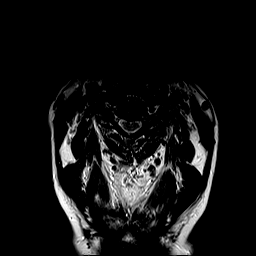
[im 26/32]
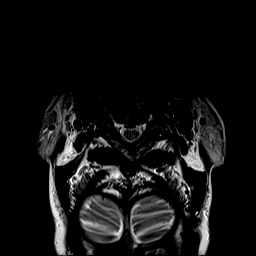
[im 32/32]
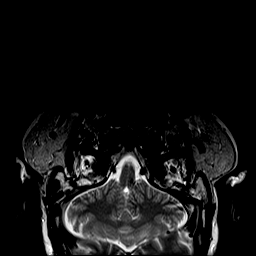

[28 of 48 positions shown; findings below may reference images not displayed]

FINDINGS: Alignment: Mild exaggerated cervical lordosis in the upper portion.
Straightening of the normal lower cervical lordosis.

Vertebrae: No fracture or primary bone lesion.

Cord: No primary cord lesion.  See below regarding stenosis at C3-4.

Posterior Fossa, vertebral arteries, paraspinal tissues: Negative

Disc levels:

Foramen magnum is widely patent. No significant finding at C1-2 or
C2-3.

C3-4: Endplate osteophytes and bulging of the disc. Facet and
ligamentous prominence. Spinal stenosis with AP diameter in the
midline measuring 8 mm. Effacement of the subarachnoid space and
slight try angulation of the cord. No abnormal cord signal.
Bilateral foraminal stenosis could affect either C4 nerve.

C4-5: Endplate osteophytes and mild bulging of the disc. AP diameter
of the canal in the midline measures 9 mm. Bilateral facet
osteoarthritis. Mild bilateral foraminal narrowing.

C5-6: Endplate osteophytes and bulging of the disc. No canal
stenosis. Mild bilateral foraminal narrowing.

C6-7: Spondylosis with endplate osteophytes and bulging of the disc.
No compressive canal stenosis. Moderate bilateral foraminal
narrowing.

C7-T1: Endplate osteophytes and mild bulging of the disc. Mild facet
arthritis. No compressive canal stenosis. Mild bilateral foraminal
narrowing.
IMPRESSION: Chronic cervical degenerative changes as outlined above. Canal
stenosis at C3-4 with AP diameter of only 8 mm. Effacement of the
subarachnoid space and slight try angulation of cord without
abnormal cord signal.

Foraminal stenosis that could possibly cause neural compression
bilaterally at C3-4 and C6-7. Lesser foraminal narrowing at C4-5,
C5-6 and C7-T1.
